# Patient Record
Sex: Female | Born: 1940
Health system: Southern US, Community
[De-identification: ages and names within clinical notes are randomized; demographics above are authoritative.]

## PROBLEM LIST (undated history)

## (undated) DIAGNOSIS — K219 Gastro-esophageal reflux disease without esophagitis: Secondary | ICD-10-CM

## (undated) DIAGNOSIS — E785 Hyperlipidemia, unspecified: Secondary | ICD-10-CM

## (undated) DIAGNOSIS — Z860101 Personal history of adenomatous and serrated colon polyps: Secondary | ICD-10-CM

## (undated) DIAGNOSIS — L570 Actinic keratosis: Secondary | ICD-10-CM

## (undated) DIAGNOSIS — Z86010 Personal history of colonic polyps: Secondary | ICD-10-CM

## (undated) HISTORY — PX: FLEXIBLE SIGMOIDOSCOPY: SHX1649

## (undated) HISTORY — DX: Actinic keratosis: L57.0

## (undated) HISTORY — PX: CHOLECYSTECTOMY: SHX55

## (undated) HISTORY — PX: ESOPHAGOGASTRODUODENOSCOPY: SHX1529

## (undated) HISTORY — DX: Hyperlipidemia, unspecified: E78.5

## (undated) HISTORY — PX: VARICOSE VEIN SURGERY: SHX832

## (undated) HISTORY — PX: APPENDECTOMY: SHX54

## (undated) HISTORY — PX: COLONOSCOPY: SHX174

## (undated) HISTORY — PX: BREAST CYST ASPIRATION: SHX578

---

## 2005-01-10 ENCOUNTER — Ambulatory Visit: Payer: Self-pay | Admitting: Obstetrics and Gynecology

## 2005-02-15 ENCOUNTER — Ambulatory Visit: Payer: Self-pay | Admitting: Gastroenterology

## 2006-01-30 ENCOUNTER — Ambulatory Visit: Payer: Self-pay | Admitting: Obstetrics and Gynecology

## 2007-02-14 ENCOUNTER — Ambulatory Visit: Payer: Self-pay | Admitting: Obstetrics and Gynecology

## 2007-08-19 ENCOUNTER — Ambulatory Visit: Payer: Self-pay | Admitting: Surgery

## 2008-07-14 ENCOUNTER — Ambulatory Visit: Payer: Self-pay | Admitting: Unknown Physician Specialty

## 2008-10-07 ENCOUNTER — Ambulatory Visit: Payer: Self-pay | Admitting: Obstetrics and Gynecology

## 2009-10-11 ENCOUNTER — Ambulatory Visit: Payer: Self-pay | Admitting: Obstetrics and Gynecology

## 2010-09-21 ENCOUNTER — Ambulatory Visit: Payer: Self-pay | Admitting: Family Medicine

## 2010-10-13 ENCOUNTER — Ambulatory Visit: Payer: Self-pay | Admitting: Obstetrics and Gynecology

## 2010-11-15 ENCOUNTER — Ambulatory Visit: Payer: Self-pay | Admitting: Obstetrics and Gynecology

## 2011-10-16 ENCOUNTER — Ambulatory Visit: Payer: Self-pay | Admitting: Obstetrics and Gynecology

## 2012-12-19 ENCOUNTER — Ambulatory Visit: Payer: Self-pay | Admitting: Obstetrics and Gynecology

## 2013-11-14 ENCOUNTER — Ambulatory Visit: Payer: Self-pay | Admitting: Unknown Physician Specialty

## 2013-11-17 LAB — PATHOLOGY REPORT

## 2014-03-13 ENCOUNTER — Ambulatory Visit: Payer: Self-pay | Admitting: Obstetrics and Gynecology

## 2015-01-21 ENCOUNTER — Encounter: Payer: Self-pay | Admitting: Family Medicine

## 2015-01-21 ENCOUNTER — Ambulatory Visit (INDEPENDENT_AMBULATORY_CARE_PROVIDER_SITE_OTHER): Payer: Medicare Other | Admitting: Family Medicine

## 2015-01-21 VITALS — BP 144/84 | HR 61 | Temp 98.7°F | Ht 63.0 in | Wt 142.0 lb

## 2015-01-21 DIAGNOSIS — R6889 Other general symptoms and signs: Secondary | ICD-10-CM | POA: Diagnosis not present

## 2015-01-21 DIAGNOSIS — H6123 Impacted cerumen, bilateral: Secondary | ICD-10-CM | POA: Insufficient documentation

## 2015-01-21 DIAGNOSIS — H918X9 Other specified hearing loss, unspecified ear: Secondary | ICD-10-CM | POA: Diagnosis not present

## 2015-01-21 DIAGNOSIS — H6120 Impacted cerumen, unspecified ear: Secondary | ICD-10-CM | POA: Diagnosis not present

## 2015-01-21 DIAGNOSIS — E7800 Pure hypercholesterolemia, unspecified: Secondary | ICD-10-CM

## 2015-01-21 LAB — LP+ALT+AST PICCOLO, WAIVED
ALT (SGPT) Piccolo, Waived: 17 U/L (ref 10–47)
AST (SGOT) Piccolo, Waived: 44 U/L — ABNORMAL HIGH (ref 11–38)
Chol/HDL Ratio Piccolo,Waive: 2.4 mg/dL
Cholesterol Piccolo, Waived: 160 mg/dL
HDL Chol Piccolo, Waived: 66 mg/dL
LDL Chol Calc Piccolo Waived: 81 mg/dL
Triglycerides Piccolo,Waived: 68 mg/dL
VLDL Chol Calc Piccolo,Waive: 14 mg/dL

## 2015-01-21 MED ORDER — SIMVASTATIN 20 MG PO TABS: 20.0000 mg | ORAL_TABLET | Freq: Every day | ORAL | Status: DC

## 2015-01-21 NOTE — Progress Notes (Signed)
   BP 144/84 mmHg  Pulse 61  Temp(Src) 98.7 F (37.1 C)  Ht 5\' 3"  (1.6 m)  Wt 142 lb (64.411 kg)  BMI 25.16 kg/m2  SpO2 97%   Subjective:    Patient ID: Brianna Stevens, female    DOB: 1940/08/13, 74 y.o.   MRN: [id number]  HPI: Brianna Stevens is a 74 y.o. female  Chief Complaint  Patient presents with  . ear cleaning  . Hyperlipidemia  . labwork   patient taking simvastatin with no issues or concerns takes everyday no noted side effects has been taking long-term with good lipid readings Had full blood work done at physical needs 6 month blood work done today  Patient also with stopped up ears over the last month or so  Patient also with legs extremities seem to be colder than usual with some cold intolerance and fatigue symptoms Patient also with history of varicose faint and some edema at the end of the day especially worse since on a barstool Relevant past medical, surgical, family and social history reviewed and updated as indicated. Interim medical history since our last visit reviewed. Allergies and medications reviewed and updated.  Review of Systems  Constitutional: Negative.   Respiratory: Negative.   Cardiovascular: Negative.     Per HPI unless specifically indicated above     Objective:    BP 144/84 mmHg  Pulse 61  Temp(Src) 98.7 F (37.1 C)  Ht 5\' 3"  (1.6 m)  Wt 142 lb (64.411 kg)  BMI 25.16 kg/m2  SpO2 97%  Wt Readings from Last 3 Encounters:  01/21/15 142 lb (64.411 kg)  12/09/12 138 lb (62.596 kg)    Physical Exam  Constitutional: She is oriented to person, place, and time. She appears well-developed and well-nourished. No distress.  HENT:  Head: Normocephalic and atraumatic.  Right Ear: Hearing normal.  Left Ear: Hearing normal.  Nose: Nose normal.  Ear canals plugged with cerumen removed with water and instrumentation revealing normal ear canals and tympanic membranes  Eyes: Conjunctivae and lids are normal. Right eye exhibits no  discharge. Left eye exhibits no discharge. No scleral icterus.  Pulmonary/Chest: Effort normal. No respiratory distress.  Musculoskeletal: Normal range of motion.  Legs normal with no edema mild varicosities  Neurological: She is alert and oriented to person, place, and time.  Skin: Skin is intact. No rash noted.  Psychiatric: She has a normal mood and affect. Her speech is normal and behavior is normal. Judgment and thought content normal. Cognition and memory are normal.        Assessment & Plan:   Problem List Items Addressed This Visit      Nervous and Auditory   Bilateral hearing loss due to cerumen impaction     Other   Hypercholesteremia - Primary   Relevant Medications   simvastatin (ZOCOR) 20 MG tablet   Other Relevant Orders   LP+ALT+AST Piccolo, Waived    Other Visit Diagnoses    Cold intolerance        Will check TSH    Relevant Orders    TSH        Follow up plan: Return in about 1 year (around 01/21/2016) for LIPID RECHECK.

## 2015-01-22 LAB — TSH: TSH: 2.42 u[IU]/mL (ref 0.450–4.500)

## 2015-01-25 ENCOUNTER — Encounter: Payer: Self-pay | Admitting: Family Medicine

## 2015-02-05 ENCOUNTER — Telehealth: Payer: Self-pay

## 2015-02-05 ENCOUNTER — Other Ambulatory Visit: Payer: Self-pay | Admitting: Family Medicine

## 2015-02-05 MED ORDER — MECLIZINE HCL 32 MG PO TABS: 32.0000 mg | ORAL_TABLET | Freq: Three times a day (TID) | ORAL | Status: DC | PRN

## 2015-02-05 NOTE — Telephone Encounter (Signed)
Corrected Meclizine Rx to 25mg  vs 32, needs to be corrected in chart Pharmacy accepted verbal order

## 2015-02-16 ENCOUNTER — Other Ambulatory Visit: Payer: Self-pay | Admitting: Obstetrics and Gynecology

## 2015-02-16 DIAGNOSIS — Z1231 Encounter for screening mammogram for malignant neoplasm of breast: Secondary | ICD-10-CM

## 2015-03-18 ENCOUNTER — Ambulatory Visit
Admission: RE | Admit: 2015-03-18 | Discharge: 2015-03-18 | Disposition: A | Discharge status: Home / Self Care | Payer: Medicare Other | Source: Ambulatory Visit | Attending: Obstetrics and Gynecology | Admitting: Obstetrics and Gynecology

## 2015-03-18 DIAGNOSIS — Z1231 Encounter for screening mammogram for malignant neoplasm of breast: Secondary | ICD-10-CM | POA: Diagnosis not present

## 2015-06-10 ENCOUNTER — Encounter: Payer: Self-pay | Admitting: Family Medicine

## 2015-07-26 ENCOUNTER — Other Ambulatory Visit: Payer: Self-pay | Admitting: Family Medicine

## 2015-07-26 ENCOUNTER — Other Ambulatory Visit: Payer: PPO

## 2015-07-26 DIAGNOSIS — Z5181 Encounter for therapeutic drug level monitoring: Secondary | ICD-10-CM | POA: Diagnosis not present

## 2015-07-26 DIAGNOSIS — R748 Abnormal levels of other serum enzymes: Secondary | ICD-10-CM | POA: Diagnosis not present

## 2015-07-27 ENCOUNTER — Telehealth: Payer: Self-pay | Admitting: Family Medicine

## 2015-07-27 DIAGNOSIS — R7309 Other abnormal glucose: Secondary | ICD-10-CM

## 2015-07-27 DIAGNOSIS — E875 Hyperkalemia: Secondary | ICD-10-CM | POA: Insufficient documentation

## 2015-07-27 LAB — COMPREHENSIVE METABOLIC PANEL
ALT: 11 IU/L (ref 0–32)
AST: 20 IU/L (ref 0–40)
Albumin/Globulin Ratio: 1.4 (ref 1.1–2.5)
Albumin: 4.4 g/dL (ref 3.5–4.8)
Alkaline Phosphatase: 58 IU/L (ref 39–117)
BUN / CREAT RATIO: 13 (ref 11–26)
BUN: 12 mg/dL (ref 8–27)
Bilirubin Total: 0.5 mg/dL (ref 0.0–1.2)
CHLORIDE: 104 mmol/L (ref 96–106)
CO2: 24 mmol/L (ref 18–29)
CREATININE: 0.93 mg/dL (ref 0.57–1.00)
Calcium: 9.7 mg/dL (ref 8.7–10.3)
GFR calc Af Amer: 69 mL/min/{1.73_m2} (ref >59)
GFR calc non Af Amer: 60 mL/min/{1.73_m2} (ref >59)
GLUCOSE: 104 mg/dL — AB (ref 65–99)
Globulin, Total: 3.1 g/dL (ref 1.5–4.5)
Potassium: 5.8 mmol/L — ABNORMAL HIGH (ref 3.5–5.2)
SODIUM: 142 mmol/L (ref 134–144)
Total Protein: 7.5 g/dL (ref 6.0–8.5)

## 2015-07-27 LAB — LIPID PANEL W/O CHOL/HDL RATIO
Cholesterol, Total: 192 mg/dL (ref 100–199)
HDL: 66 mg/dL (ref >39)
LDL Calculated: 111 mg/dL — ABNORMAL HIGH (ref 0–99)
TRIGLYCERIDES: 73 mg/dL (ref 0–149)
VLDL CHOLESTEROL CAL: 15 mg/dL (ref 5–40)

## 2015-07-27 NOTE — Telephone Encounter (Signed)
I never received a call from the lab Izora Gala, Please call patient ask her to get a STAT potassium drawn on Wednesday (see note below) Thank you

## 2015-07-27 NOTE — Telephone Encounter (Signed)
Patient's K+ is elevated; I called her home number, left msg, asked her to call back ASAP, very important I have entered STAT K+ to be drawn at the hospital [facility] staff:  When patient calls, let her know one of her labs was very high, her potassium; we need to have her get a STAT recheck at the hospital NOW Sometimes that is the result of red blood cells breaking open inside the tube which is not worrisome; however, if her potassium is really as high as it is, we'll need to do something about it I have entered the order, and put my cell # for the results to be called to me this afternoon Please just document where I can reach the patient when her lab comes back so I can call her

## 2015-07-28 ENCOUNTER — Other Ambulatory Visit
Admission: RE | Admit: 2015-07-28 | Discharge: 2015-07-28 | Disposition: A | Discharge status: Home / Self Care | Payer: PPO | Source: Ambulatory Visit | Attending: Family Medicine | Admitting: Family Medicine

## 2015-07-28 DIAGNOSIS — R7301 Impaired fasting glucose: Secondary | ICD-10-CM | POA: Insufficient documentation

## 2015-07-28 DIAGNOSIS — R7309 Other abnormal glucose: Secondary | ICD-10-CM | POA: Insufficient documentation

## 2015-07-28 DIAGNOSIS — E875 Hyperkalemia: Secondary | ICD-10-CM | POA: Insufficient documentation

## 2015-07-28 LAB — POTASSIUM: Potassium: 4.7 mmol/L (ref 3.5–5.1)

## 2015-07-28 NOTE — Telephone Encounter (Signed)
Called patient, no answer but left msg that all was well and to call back with any questions or concerns.

## 2015-07-28 NOTE — Telephone Encounter (Signed)
Please let patient know that her recheck potassium was completely normal -- great news I suspect that her first blood tube was hemolyzed and that interfered with the true reading Her other labs overall look very good; her glucose is a little above normal, but not in the diabetes range or anything; just watch sweets and f/u with Dr. Jeananne Rama at next appt

## 2015-07-28 NOTE — Telephone Encounter (Signed)
I spoke with patient this morning, she got her voicemail last night about 8 o'clock.  She is going to have the bloodwork done today as soon as she can get dressed.

## 2015-10-20 DIAGNOSIS — K219 Gastro-esophageal reflux disease without esophagitis: Secondary | ICD-10-CM | POA: Diagnosis not present

## 2015-10-20 DIAGNOSIS — R140 Abdominal distension (gaseous): Secondary | ICD-10-CM | POA: Diagnosis not present

## 2015-12-30 DIAGNOSIS — L84 Corns and callosities: Secondary | ICD-10-CM | POA: Diagnosis not present

## 2015-12-30 DIAGNOSIS — D485 Neoplasm of uncertain behavior of skin: Secondary | ICD-10-CM | POA: Diagnosis not present

## 2015-12-30 DIAGNOSIS — I8393 Asymptomatic varicose veins of bilateral lower extremities: Secondary | ICD-10-CM | POA: Diagnosis not present

## 2015-12-30 DIAGNOSIS — D1800 Hemangioma unspecified site: Secondary | ICD-10-CM | POA: Diagnosis not present

## 2015-12-30 DIAGNOSIS — L812 Freckles: Secondary | ICD-10-CM | POA: Diagnosis not present

## 2015-12-30 DIAGNOSIS — Z1283 Encounter for screening for malignant neoplasm of skin: Secondary | ICD-10-CM | POA: Diagnosis not present

## 2015-12-30 DIAGNOSIS — R21 Rash and other nonspecific skin eruption: Secondary | ICD-10-CM | POA: Diagnosis not present

## 2015-12-30 DIAGNOSIS — L578 Other skin changes due to chronic exposure to nonionizing radiation: Secondary | ICD-10-CM | POA: Diagnosis not present

## 2015-12-30 DIAGNOSIS — L821 Other seborrheic keratosis: Secondary | ICD-10-CM | POA: Diagnosis not present

## 2015-12-30 DIAGNOSIS — D229 Melanocytic nevi, unspecified: Secondary | ICD-10-CM | POA: Diagnosis not present

## 2015-12-30 DIAGNOSIS — Z85828 Personal history of other malignant neoplasm of skin: Secondary | ICD-10-CM | POA: Diagnosis not present

## 2016-01-25 ENCOUNTER — Ambulatory Visit: Payer: Medicare Other | Admitting: Family Medicine

## 2016-02-01 ENCOUNTER — Ambulatory Visit (INDEPENDENT_AMBULATORY_CARE_PROVIDER_SITE_OTHER): Payer: PPO | Admitting: Family Medicine

## 2016-02-01 ENCOUNTER — Encounter: Payer: Self-pay | Admitting: Family Medicine

## 2016-02-01 VITALS — BP 149/81 | HR 60 | Temp 97.7°F | Ht 62.5 in | Wt 142.0 lb

## 2016-02-01 DIAGNOSIS — H6123 Impacted cerumen, bilateral: Secondary | ICD-10-CM | POA: Diagnosis not present

## 2016-02-01 DIAGNOSIS — E875 Hyperkalemia: Secondary | ICD-10-CM

## 2016-02-01 DIAGNOSIS — E7800 Pure hypercholesterolemia, unspecified: Secondary | ICD-10-CM

## 2016-02-01 DIAGNOSIS — R748 Abnormal levels of other serum enzymes: Secondary | ICD-10-CM | POA: Diagnosis not present

## 2016-02-01 DIAGNOSIS — R7309 Other abnormal glucose: Secondary | ICD-10-CM | POA: Diagnosis not present

## 2016-02-01 LAB — URINALYSIS, ROUTINE W REFLEX MICROSCOPIC
Bilirubin, UA: NEGATIVE
Glucose, UA: NEGATIVE
KETONES UA: NEGATIVE
Leukocytes, UA: NEGATIVE
NITRITE UA: NEGATIVE
Protein, UA: NEGATIVE
Specific Gravity, UA: 1.015 (ref 1.005–1.030)
UUROB: 0.2 mg/dL (ref 0.2–1.0)
pH, UA: 8 — ABNORMAL HIGH (ref 5.0–7.5)

## 2016-02-01 LAB — MICROSCOPIC EXAMINATION: WBC UA: NONE SEEN /HPF (ref 0–?)

## 2016-02-01 MED ORDER — SIMVASTATIN 20 MG PO TABS: 20.0000 mg | ORAL_TABLET | Freq: Every day | ORAL | 12 refills | Status: DC

## 2016-02-01 NOTE — Assessment & Plan Note (Signed)
Check LABS

## 2016-02-01 NOTE — Assessment & Plan Note (Signed)
Check labs 

## 2016-02-01 NOTE — Progress Notes (Signed)
BP (!) 149/81 (BP Location: Left Arm, Patient Position: Sitting, Cuff Size: Normal)   Pulse 60   Temp 97.7 F (36.5 C)   Ht 5' 2.5" (1.588 m)   Wt 142 lb (64.4 kg)   SpO2 99%   BMI 25.56 kg/m    Subjective:    Patient ID: Brianna Stevens, female    DOB: September 05, 1940, 75 y.o.   MRN: [id number]  HPI: Brianna Stevens is a 75 y.o. female  Patient recheck cholesterol lipids been doing well with no complaints of indications taken faithfully Has had an history of elevated glucose in the past with high potassium Will check labs to review Has intermittent vertigo takes meclizine which seems to help Intermittent allergies  Reflux controlled with Prilosec   Relevant past medical, surgical, family and social history reviewed and updated as indicated. Interim medical history since our last visit reviewed. Allergies and medications reviewed and updated.  Review of Systems  Constitutional: Negative.   HENT: Negative.   Eyes: Negative.   Respiratory: Negative.   Cardiovascular: Negative.   Gastrointestinal: Negative.   Endocrine: Negative.   Genitourinary: Negative.   Musculoskeletal: Negative.   Skin: Negative.   Allergic/Immunologic: Negative.   Neurological: Negative.   Hematological: Negative.   Psychiatric/Behavioral: Negative.     Per HPI unless specifically indicated above     Objective:    BP (!) 149/81 (BP Location: Left Arm, Patient Position: Sitting, Cuff Size: Normal)   Pulse 60   Temp 97.7 F (36.5 C)   Ht 5' 2.5" (1.588 m)   Wt 142 lb (64.4 kg)   SpO2 99%   BMI 25.56 kg/m   Wt Readings from Last 3 Encounters:  02/01/16 142 lb (64.4 kg)  01/21/15 142 lb (64.4 kg)  12/09/12 138 lb (62.6 kg)    Physical Exam  Constitutional: She is oriented to person, place, and time. She appears well-developed and well-nourished. No distress.  HENT:  Head: Normocephalic and atraumatic.  Right Ear: Hearing and external ear normal.  Left Ear: Hearing and external ear  normal.  Nose: Nose normal.  Mouth/Throat: No oropharyngeal exudate.  Ears blocked with cerumen removed with instruments and water with resolution of decreased hearing especially in right ear  Eyes: Conjunctivae and lids are normal. Right eye exhibits no discharge. Left eye exhibits no discharge. No scleral icterus.  Cardiovascular: Normal rate, regular rhythm and normal heart sounds.   Pulmonary/Chest: Effort normal and breath sounds normal. No respiratory distress.  Abdominal: Soft. Bowel sounds are normal.  Musculoskeletal: Normal range of motion. She exhibits no edema, tenderness or deformity.  Neurological: She is alert and oriented to person, place, and time.  Skin: Skin is intact. No rash noted.  Psychiatric: She has a normal mood and affect. Her speech is normal and behavior is normal. Judgment and thought content normal. Cognition and memory are normal.    Results for orders placed or performed during the hospital encounter of 07/28/15  Potassium  Result Value Ref Range   Potassium 4.7 3.5 - 5.1 mmol/L      Assessment & Plan:   Problem List Items Addressed This Visit      Other   Hypercholesteremia - Primary    The current medical regimen is effective;  continue present plan and medications.       Relevant Medications   simvastatin (ZOCOR) 20 MG tablet   Other Relevant Orders   Comprehensive metabolic panel   Lipid panel   CBC with Differential/Platelet  TSH   Urinalysis, Routine w reflex microscopic (not at [facility])   Elevated glucose    Check LABS      Relevant Orders   Comprehensive metabolic panel   CBC with Differential/Platelet   TSH   Urinalysis, Routine w reflex microscopic (not at [facility])   RESOLVED: Abnormal liver enzymes    Check labs      Relevant Orders   Comprehensive metabolic panel   CBC with Differential/Platelet   TSH   Urinalysis, Routine w reflex microscopic (not at [facility])   RESOLVED: Hyperkalemia    Check labs      Relevant Orders     Comprehensive metabolic panel   CBC with Differential/Platelet   TSH   Urinalysis, Routine w reflex microscopic (not at [facility])    Other Visit Diagnoses   None.      Follow up plan: Return in about 6 months (around 08/03/2016) for lipid panel alt, ast.

## 2016-02-01 NOTE — Assessment & Plan Note (Signed)
The current medical regimen is effective;  continue present plan and medications.  

## 2016-02-02 LAB — COMPREHENSIVE METABOLIC PANEL
A/G RATIO: 1.5 (ref 1.2–2.2)
ALT: 17 IU/L (ref 0–32)
AST: 25 IU/L (ref 0–40)
Albumin: 4.5 g/dL (ref 3.5–4.8)
Alkaline Phosphatase: 62 IU/L (ref 39–117)
BUN/Creatinine Ratio: 16 (ref 12–28)
BUN: 14 mg/dL (ref 8–27)
Bilirubin Total: 0.5 mg/dL (ref 0.0–1.2)
CALCIUM: 9.9 mg/dL (ref 8.7–10.3)
CO2: 26 mmol/L (ref 18–29)
Chloride: 100 mmol/L (ref 96–106)
Creatinine, Ser: 0.88 mg/dL (ref 0.57–1.00)
GFR calc Af Amer: 74 mL/min/{1.73_m2} (ref >59)
GFR, EST NON AFRICAN AMERICAN: 64 mL/min/{1.73_m2} (ref >59)
GLUCOSE: 94 mg/dL (ref 65–99)
Globulin, Total: 3 g/dL (ref 1.5–4.5)
POTASSIUM: 5.4 mmol/L — AB (ref 3.5–5.2)
Sodium: 142 mmol/L (ref 134–144)
Total Protein: 7.5 g/dL (ref 6.0–8.5)

## 2016-02-02 LAB — CBC WITH DIFFERENTIAL/PLATELET
Basophils Absolute: 0 10*3/uL (ref 0.0–0.2)
Basos: 1 %
EOS (ABSOLUTE): 0.2 10*3/uL (ref 0.0–0.4)
EOS: 3 %
HEMATOCRIT: 42.6 % (ref 34.0–46.6)
HEMOGLOBIN: 14.3 g/dL (ref 11.1–15.9)
Immature Grans (Abs): 0 10*3/uL (ref 0.0–0.1)
Immature Granulocytes: 0 %
LYMPHS ABS: 3.6 10*3/uL — AB (ref 0.7–3.1)
Lymphs: 54 %
MCH: 31 pg (ref 26.6–33.0)
MCHC: 33.6 g/dL (ref 31.5–35.7)
MCV: 92 fL (ref 79–97)
MONOCYTES: 9 %
Monocytes Absolute: 0.6 10*3/uL (ref 0.1–0.9)
NEUTROS ABS: 2.1 10*3/uL (ref 1.4–7.0)
Neutrophils: 33 %
Platelets: 236 10*3/uL (ref 150–379)
RBC: 4.61 x10E6/uL (ref 3.77–5.28)
RDW: 13.3 % (ref 12.3–15.4)
WBC: 6.5 10*3/uL (ref 3.4–10.8)

## 2016-02-02 LAB — LIPID PANEL
CHOL/HDL RATIO: 3 ratio (ref 0.0–4.4)
Cholesterol, Total: 174 mg/dL (ref 100–199)
HDL: 58 mg/dL (ref >39)
LDL Calculated: 91 mg/dL (ref 0–99)
TRIGLYCERIDES: 125 mg/dL (ref 0–149)
VLDL CHOLESTEROL CAL: 25 mg/dL (ref 5–40)

## 2016-02-02 LAB — TSH: TSH: 3.16 u[IU]/mL (ref 0.450–4.500)

## 2016-02-03 ENCOUNTER — Telehealth: Payer: Self-pay | Admitting: Family Medicine

## 2016-02-03 DIAGNOSIS — E875 Hyperkalemia: Secondary | ICD-10-CM

## 2016-02-03 NOTE — Telephone Encounter (Signed)
Phone call Discussed with patient elevated potassium will recheck BMP later this month

## 2016-02-18 DIAGNOSIS — Z1211 Encounter for screening for malignant neoplasm of colon: Secondary | ICD-10-CM | POA: Diagnosis not present

## 2016-02-18 DIAGNOSIS — Z01419 Encounter for gynecological examination (general) (routine) without abnormal findings: Secondary | ICD-10-CM | POA: Diagnosis not present

## 2016-02-18 DIAGNOSIS — Z124 Encounter for screening for malignant neoplasm of cervix: Secondary | ICD-10-CM | POA: Diagnosis not present

## 2016-02-24 ENCOUNTER — Other Ambulatory Visit: Payer: Self-pay | Admitting: Obstetrics and Gynecology

## 2016-02-24 DIAGNOSIS — Z1231 Encounter for screening mammogram for malignant neoplasm of breast: Secondary | ICD-10-CM

## 2016-03-10 ENCOUNTER — Other Ambulatory Visit: Payer: PPO

## 2016-03-10 DIAGNOSIS — E875 Hyperkalemia: Secondary | ICD-10-CM | POA: Diagnosis not present

## 2016-03-11 LAB — BASIC METABOLIC PANEL
BUN/Creatinine Ratio: 16 (ref 12–28)
BUN: 15 mg/dL (ref 8–27)
CHLORIDE: 101 mmol/L (ref 96–106)
CO2: 26 mmol/L (ref 18–29)
CREATININE: 0.95 mg/dL (ref 0.57–1.00)
Calcium: 9.8 mg/dL (ref 8.7–10.3)
GFR calc Af Amer: 67 mL/min/{1.73_m2} (ref >59)
GFR calc non Af Amer: 58 mL/min/{1.73_m2} — ABNORMAL LOW (ref >59)
GLUCOSE: 99 mg/dL (ref 65–99)
Potassium: 4.9 mmol/L (ref 3.5–5.2)
SODIUM: 142 mmol/L (ref 134–144)

## 2016-03-12 ENCOUNTER — Encounter: Payer: Self-pay | Admitting: Family Medicine

## 2016-03-20 ENCOUNTER — Ambulatory Visit
Admission: RE | Admit: 2016-03-20 | Discharge: 2016-03-20 | Disposition: A | Discharge status: Home / Self Care | Payer: PPO | Source: Ambulatory Visit | Attending: Obstetrics and Gynecology | Admitting: Obstetrics and Gynecology

## 2016-03-20 ENCOUNTER — Other Ambulatory Visit: Payer: Self-pay | Admitting: Obstetrics and Gynecology

## 2016-03-20 DIAGNOSIS — Z1231 Encounter for screening mammogram for malignant neoplasm of breast: Secondary | ICD-10-CM

## 2016-07-10 ENCOUNTER — Other Ambulatory Visit: Payer: Self-pay | Admitting: Family Medicine

## 2016-07-10 NOTE — Telephone Encounter (Signed)
Routing to provider. Appt on 08/03/16.

## 2016-08-03 ENCOUNTER — Encounter: Payer: Self-pay | Admitting: Family Medicine

## 2016-08-03 ENCOUNTER — Ambulatory Visit (INDEPENDENT_AMBULATORY_CARE_PROVIDER_SITE_OTHER): Payer: PPO | Admitting: Family Medicine

## 2016-08-03 VITALS — BP 142/89 | HR 67 | Temp 97.8°F | Ht 63.0 in | Wt 148.0 lb

## 2016-08-03 DIAGNOSIS — H6123 Impacted cerumen, bilateral: Secondary | ICD-10-CM | POA: Diagnosis not present

## 2016-08-03 DIAGNOSIS — R7309 Other abnormal glucose: Secondary | ICD-10-CM

## 2016-08-03 DIAGNOSIS — E7800 Pure hypercholesterolemia, unspecified: Secondary | ICD-10-CM | POA: Diagnosis not present

## 2016-08-03 LAB — LP+ALT+AST PICCOLO, WAIVED
ALT (SGPT) Piccolo, Waived: 35 U/L (ref 10–47)
AST (SGOT) Piccolo, Waived: 16 U/L (ref 11–38)
CHOL/HDL RATIO PICCOLO,WAIVE: 2.7 mg/dL
CHOLESTEROL PICCOLO, WAIVED: 186 mg/dL (ref <200)
HDL CHOL PICCOLO, WAIVED: 69 mg/dL (ref >59)
LDL CHOL CALC PICCOLO WAIVED: 99 mg/dL (ref <100)
TRIGLYCERIDES PICCOLO,WAIVED: 87 mg/dL (ref <150)
VLDL Chol Calc Piccolo,Waive: 17 mg/dL (ref <30)

## 2016-08-03 MED ORDER — MECLIZINE HCL 25 MG PO TABS: 25.0000 mg | ORAL_TABLET | Freq: Three times a day (TID) | ORAL | 7 refills | Status: DC | PRN

## 2016-08-03 NOTE — Assessment & Plan Note (Signed)
The current medical regimen is effective;  continue present plan and medications.  

## 2016-08-03 NOTE — Progress Notes (Signed)
BP (!) 142/89   Pulse 67   Temp 97.8 F (36.6 C) (Oral)   Ht 5\' 3"  (1.6 m)   Wt 148 lb (67.1 kg)   BMI 26.22 kg/m    Subjective:    Patient ID: Brianna Stevens, female    DOB: 04/13/1941, 76 y.o.   MRN: [id number]  HPI: Brianna Stevens is a 76 y.o. female  Chief Complaint  Patient presents with  . Follow-up  . Ear Fullness    Right.    Ear fullness is been ongoing for several weeks patient also has intermittent dizziness. On further review sometimes has some tinnitus and some ear muffling as noted above. This comes and goes meclizine seems to help the most sometimes able to walk with her head tilted to the side and controls her symptoms that way. Has done Epley maneuvers with controlling her symptoms also. Takes cholesterol medicine without problems or issues taking faithfully each of relatively good diet. Relevant past medical, surgical, family and social history reviewed and updated as indicated. Interim medical history since our last visit reviewed. Allergies and medications reviewed and updated.  Review of Systems  Constitutional: Negative.   Respiratory: Negative.   Cardiovascular: Negative.     Per HPI unless specifically indicated above     Objective:    BP (!) 142/89   Pulse 67   Temp 97.8 F (36.6 C) (Oral)   Ht 5\' 3"  (1.6 m)   Wt 148 lb (67.1 kg)   BMI 26.22 kg/m   Wt Readings from Last 3 Encounters:  08/03/16 148 lb (67.1 kg)  02/01/16 142 lb (64.4 kg)  01/21/15 142 lb (64.4 kg)    Physical Exam  Constitutional: She is oriented to person, place, and time. She appears well-developed and well-nourished. No distress.  HENT:  Head: Normocephalic and atraumatic.  Right Ear: Hearing and external ear normal.  Left Ear: Hearing and external ear normal.  Nose: Nose normal.  Right ear blocked with wax removed with instruments and water revealing normal canal TM and resolution of patient's symptoms left ear also cleaned out with instruments and water.    Eyes: Conjunctivae and lids are normal. Right eye exhibits no discharge. Left eye exhibits no discharge. No scleral icterus.  Cardiovascular: Normal rate, regular rhythm and normal heart sounds.   Pulmonary/Chest: Effort normal and breath sounds normal. No respiratory distress.  Musculoskeletal: Normal range of motion.  Neurological: She is alert and oriented to person, place, and time.  Skin: Skin is intact. No rash noted.  Psychiatric: She has a normal mood and affect. Her speech is normal and behavior is normal. Judgment and thought content normal. Cognition and memory are normal.    Results for orders placed or performed in visit on [id number]  Basic metabolic panel  Result Value Ref Range   Glucose 99 65 - 99 mg/dL   BUN 15 8 - 27 mg/dL   Creatinine, Ser 0.95 0.57 - 1.00 mg/dL   GFR calc non Af Amer 58 (L) >59 mL/min/1.73   GFR calc Af Amer 67 >59 mL/min/1.73   BUN/Creatinine Ratio 16 12 - 28   Sodium 142 134 - 144 mmol/L   Potassium 4.9 3.5 - 5.2 mmol/L   Chloride 101 96 - 106 mmol/L   CO2 26 18 - 29 mmol/L   Calcium 9.8 8.7 - 10.3 mg/dL      Assessment & Plan:   Problem List Items Addressed This Visit      Nervous and  Auditory   Bilateral hearing loss due to cerumen impaction     Other   Hypercholesteremia - Primary    The current medical regimen is effective;  continue present plan and medications.       Relevant Orders   LP+ALT+AST Piccolo, Waived   Elevated glucose   Relevant Orders   LP+ALT+AST Piccolo, Waived       Follow up plan: Return for As scheduled.

## 2016-11-16 ENCOUNTER — Telehealth: Payer: Self-pay | Admitting: Family Medicine

## 2016-11-16 NOTE — Telephone Encounter (Signed)
Called pt to move Annual Wellness Visit to [facility] & schedule CPE w/DR. Crissman for June - knb

## 2016-11-29 ENCOUNTER — Ambulatory Visit (INDEPENDENT_AMBULATORY_CARE_PROVIDER_SITE_OTHER): Payer: PPO

## 2016-11-29 VITALS — BP 118/70 | HR 74 | Temp 97.8°F | Resp 16 | Ht 63.0 in | Wt 146.2 lb

## 2016-11-29 DIAGNOSIS — Z0000 Encounter for general adult medical examination without abnormal findings: Secondary | ICD-10-CM | POA: Diagnosis not present

## 2016-11-29 NOTE — Patient Instructions (Addendum)
Brianna Stevens , Thank you for taking time to come for your Medicare Wellness Visit. I appreciate your ongoing commitment to your health goals. Please review the following plan we discussed and let me know if I can assist you in the future.   Screening recommendations/referrals: Colonoscopy: completed in 2013 Mammogram: completed 03/2016 Bone Density: completed 09/2011 Recommended yearly ophthalmology/optometry visit for glaucoma screening and checkup Recommended yearly dental visit for hygiene and checkup  Vaccinations: Influenza vaccine: due 03/2017 Pneumococcal vaccine: completed Tdap vaccine: declined    Shingles vaccine: Completed 07/2015  Advanced directives: Please bring a copy of your living will and health care power of attorney at your convenience.   Conditions/risks identified: Recommend to continue drinking 3-4 glasses of water a day.   Next appointment: Follow up with Dr.Crissman on 02/05/2017 at 9:45am. Follow up in one year for your annual wellness exam.   Preventive Care 65 Years and Older, Female Preventive care refers to lifestyle choices and visits with your health care provider that can promote health and wellness. What does preventive care include?  A yearly physical exam. This is also called an annual well check.  Dental exams once or twice a year.  Routine eye exams. Ask your health care provider how often you should have your eyes checked.  Personal lifestyle choices, including:  Daily care of your teeth and gums.  Regular physical activity.  Eating a healthy diet.  Avoiding tobacco and drug use.  Limiting alcohol use.  Practicing safe sex.  Taking low-dose aspirin every day.  Taking vitamin and mineral supplements as recommended by your health care provider. What happens during an annual well check? The services and screenings done by your health care provider during your annual well check will depend on your age, overall health, lifestyle risk  factors, and family history of disease. Counseling  Your health care provider may ask you questions about your:  Alcohol use.  Tobacco use.  Drug use.  Emotional well-being.  Home and relationship well-being.  Sexual activity.  Eating habits.  History of falls.  Memory and ability to understand (cognition).  Work and work Statistician.  Reproductive health. Screening  You may have the following tests or measurements:  Height, weight, and BMI.  Blood pressure.  Lipid and cholesterol levels. These may be checked every 5 years, or more frequently if you are over 50 years old.  Skin check.  Lung cancer screening. You may have this screening every year starting at age 32 if you have a 30-pack-year history of smoking and currently smoke or have quit within the past 15 years.  Fecal occult blood test (FOBT) of the stool. You may have this test every year starting at age 14.  Flexible sigmoidoscopy or colonoscopy. You may have a sigmoidoscopy every 5 years or a colonoscopy every 10 years starting at age 63.  Hepatitis C blood test.  Hepatitis B blood test.  Sexually transmitted disease (STD) testing.  Diabetes screening. This is done by checking your blood sugar (glucose) after you have not eaten for a while (fasting). You may have this done every 1-3 years.  Bone density scan. This is done to screen for osteoporosis. You may have this done starting at age 6.  Mammogram. This may be done every 1-2 years. Talk to your health care provider about how often you should have regular mammograms. Talk with your health care provider about your test results, treatment options, and if necessary, the need for more tests. Vaccines  Your health  care provider may recommend certain vaccines, such as:  Influenza vaccine. This is recommended every year.  Tetanus, diphtheria, and acellular pertussis (Tdap, Td) vaccine. You may need a Td booster every 10 years.  Zoster vaccine. You  may need this after age 76.  Pneumococcal 13-valent conjugate (PCV13) vaccine. One dose is recommended after age 10.  Pneumococcal polysaccharide (PPSV23) vaccine. One dose is recommended after age 51. Talk to your health care provider about which screenings and vaccines you need and how often you need them. This information is not intended to replace advice given to you by your health care provider. Make sure you discuss any questions you have with your health care provider. Document Released: 07/16/2015 Document Revised: 03/08/2016 Document Reviewed: 04/20/2015 Elsevier Interactive Patient Education  2017 [place] Prevention in the Home Falls can cause injuries. They can happen to people of all ages. There are many things you can do to make your home safe and to help prevent falls. What can I do on the outside of my home?  Regularly fix the edges of walkways and driveways and fix any cracks.  Remove anything that might make you trip as you walk through a door, such as a raised step or threshold.  Trim any bushes or trees on the path to your home.  Use bright outdoor lighting.  Clear any walking paths of anything that might make someone trip, such as rocks or tools.  Regularly check to see if handrails are loose or broken. Make sure that both sides of any steps have handrails.  Any raised decks and porches should have guardrails on the edges.  Have any leaves, snow, or ice cleared regularly.  Use sand or salt on walking paths during winter.  Clean up any spills in your garage right away. This includes oil or grease spills. What can I do in the bathroom?  Use night lights.  Install grab bars by the toilet and in the tub and shower. Do not use towel bars as grab bars.  Use non-skid mats or decals in the tub or shower.  If you need to sit down in the shower, use a plastic, non-slip stool.  Keep the floor dry. Clean up any water that spills on the floor as soon as  it happens.  Remove soap buildup in the tub or shower regularly.  Attach bath mats securely with double-sided non-slip rug tape.  Do not have throw rugs and other things on the floor that can make you trip. What can I do in the bedroom?  Use night lights.  Make sure that you have a light by your bed that is easy to reach.  Do not use any sheets or blankets that are too big for your bed. They should not hang down onto the floor.  Have a firm chair that has side arms. You can use this for support while you get dressed.  Do not have throw rugs and other things on the floor that can make you trip. What can I do in the kitchen?  Clean up any spills right away.  Avoid walking on wet floors.  Keep items that you use a lot in easy-to-reach places.  If you need to reach something above you, use a strong step stool that has a grab bar.  Keep electrical cords out of the way.  Do not use floor polish or wax that makes floors slippery. If you must use wax, use non-skid floor wax.  Do not have  throw rugs and other things on the floor that can make you trip. What can I do with my stairs?  Do not leave any items on the stairs.  Make sure that there are handrails on both sides of the stairs and use them. Fix handrails that are broken or loose. Make sure that handrails are as long as the stairways.  Check any carpeting to make sure that it is firmly attached to the stairs. Fix any carpet that is loose or worn.  Avoid having throw rugs at the top or bottom of the stairs. If you do have throw rugs, attach them to the floor with carpet tape.  Make sure that you have a light switch at the top of the stairs and the bottom of the stairs. If you do not have them, ask someone to add them for you. What else can I do to help prevent falls?  Wear shoes that:  Do not have high heels.  Have rubber bottoms.  Are comfortable and fit you well.  Are closed at the toe. Do not wear sandals.  If  you use a stepladder:  Make sure that it is fully opened. Do not climb a closed stepladder.  Make sure that both sides of the stepladder are locked into place.  Ask someone to hold it for you, if possible.  Clearly mark and make sure that you can see:  Any grab bars or handrails.  First and last steps.  Where the edge of each step is.  Use tools that help you move around (mobility aids) if they are needed. These include:  Canes.  Walkers.  Scooters.  Crutches.  Turn on the lights when you go into a dark area. Replace any light bulbs as soon as they burn out.  Set up your furniture so you have a clear path. Avoid moving your furniture around.  If any of your floors are uneven, fix them.  If there are any pets around you, be aware of where they are.  Review your medicines with your doctor. Some medicines can make you feel dizzy. This can increase your chance of falling. Ask your doctor what other things that you can do to help prevent falls. This information is not intended to replace advice given to you by your health care provider. Make sure you discuss any questions you have with your health care provider. Document Released: 04/15/2009 Document Revised: 11/25/2015 Document Reviewed: 07/24/2014 Elsevier Interactive Patient Education  2017 [employer].

## 2016-11-29 NOTE — Progress Notes (Signed)
Subjective:   Brianna Stevens is a 76 y.o. female who presents for an Initial Medicare Annual Wellness Visit.   Review of Systems    Cardiac Risk Factors include: advanced age (>26men, >64 women)     Objective:    Today's Vitals   11/29/16 1029  BP: 118/70  Pulse: 74  Resp: 16  Temp: 97.8 F (36.6 C)  Weight: 146 lb 3.2 oz (66.3 kg)  Height: 5\' 3"  (1.6 m)   Body mass index is 25.9 kg/m.   Current Medications (verified) Outpatient Encounter Prescriptions as of 11/29/2016  Medication Sig  . aspirin EC 81 MG tablet Take 81 mg by mouth daily.  . Black Cohosh (BLACK COHOSH HOT FLASH RELIEF) 40 MG CAPS Take by mouth.  . calcium-vitamin D 250-100 MG-UNIT per tablet Take 1 tablet by mouth 2 (two) times daily.  . fexofenadine (ALLEGRA) 180 MG tablet Take by mouth daily as needed.  . meclizine (ANTIVERT) 25 MG tablet Take 1 tablet (25 mg total) by mouth 3 (three) times daily as needed.  . Multiple Vitamin (MULTI-VITAMINS) TABS Take by mouth.  . Omega-3 Fatty Acids (FISH OIL) 1000 MG CAPS Take by mouth.  Marland Kitchen omeprazole (PRILOSEC) 20 MG capsule Take 20 mg by mouth daily.  . simvastatin (ZOCOR) 20 MG tablet Take 1 tablet (20 mg total) by mouth daily.   No facility-administered encounter medications on file as of 11/29/2016.     Allergies (verified) Pantoprazole   History: History reviewed. No pertinent past medical history. Past Surgical History:  Procedure Laterality Date  . APPENDECTOMY    . BREAST CYST ASPIRATION Bilateral   . CHOLECYSTECTOMY     Family History  Problem Relation Age of Onset  . Prostate cancer Brother 51  . Prostate cancer Brother 35   Social History   Occupational History  . Not on file.   Social History Main Topics  . Smoking status: Never Smoker  . Smokeless tobacco: Never Used  . Alcohol use 0.6 oz/week    1 Glasses of wine per week     Comment: social  . Drug use: No  . Sexual activity: Not on file    Tobacco Counseling Counseling  given: Not Answered   Activities of Daily Living In your present state of health, do you have any difficulty performing the following activities: 11/29/2016 02/01/2016  Hearing? N N  Vision? N N  Difficulty concentrating or making decisions? N N  Walking or climbing stairs? N N  Dressing or bathing? N N  Doing errands, shopping? N N  Preparing Food and eating ? N -  Using the Toilet? N -  In the past six months, have you accidently leaked urine? N -  Do you have problems with loss of bowel control? N -  Managing your Medications? N -  Managing your Finances? N -  Housekeeping or managing your Housekeeping? N -  Some recent data might be hidden    Immunizations and Health Maintenance Immunization History  Administered Date(s) Administered  . Pneumococcal Polysaccharide-23 07/03/2010   There are no preventive care reminders to display for this patient.  Patient Care Team: Guadalupe Maple, MD as PCP - General (Family Medicine) Schermerhorn, Gwen Her, MD as Referring Physician (Obstetrics and Gynecology)  Indicate any recent Medical Services you may have received from other than [facility] providers in the past year (date may be approximate).     Assessment:   This is a routine wellness examination for [facility].  Hearing/Vision screen Vision Screening Comments: Sees Dr.Newbern annually   Dietary issues and exercise activities discussed: Current Exercise Habits: Home exercise routine, Type of exercise: walking;strength training/weights, Time (Minutes): 30, Frequency (Times/Week): 3, Weekly Exercise (Minutes/Week): 90, Intensity: Mild  Goals    . Increase water intake          Recommend to continue drinking 3-4 glasses of water a day.       Depression Screen PHQ 2/9 Scores 11/29/2016 02/01/2016  PHQ - 2 Score 0 0  PHQ- 9 Score 0 -    Fall Risk Fall Risk  11/29/2016 08/03/2016 02/01/2016  Falls in the past year? No No No    Cognitive Function:     6CIT Screen 11/29/2016  What  Year? 0 points  What month? 0 points  What time? 0 points  Count back from 20 0 points  Months in reverse 2 points  Repeat phrase 2 points  Total Score 4    Screening Tests Health Maintenance  Topic Date Due  . TETANUS/TDAP  02/02/2017 (Originally 07/20/1959)  . PNA vac Low Risk Adult (2 of 2 - PCV13) 02/02/2017 (Originally 07/04/2011)  . INFLUENZA VACCINE  01/31/2017  . DEXA SCAN  Completed      Plan:  I have personally reviewed and addressed the Medicare Annual Wellness questionnaire and have noted the following in the patient's chart:  A. Medical and social history B. Use of alcohol, tobacco or illicit drugs  C. Current medications and supplements D. Functional ability and status E.  Nutritional status F.  Physical activity G. Advance directives H. List of other physicians I.  Hospitalizations, surgeries, and ER visits in previous 12 months J.  [place] such as hearing and vision if needed, cognitive and depression L. Referrals and appointments - 02/05/2017 at 9:45am with Dr.Crissman  In addition, I have reviewed and discussed with patient certain preventive protocols, quality metrics, and best practice recommendations. A written personalized care plan for preventive services as well as general preventive health recommendations were provided to patient.   Signed,  Tyler Aas, LPN Nurse Health Advisor   MD Recommendations: none   I, as supervising physician, who reviewed the nurse health advisors Medicare wellness visit note for this patient and concur with the findings and recommendations listed above. Golden Pop M.D.

## 2016-12-26 ENCOUNTER — Other Ambulatory Visit: Payer: Self-pay | Admitting: Family Medicine

## 2016-12-26 NOTE — Telephone Encounter (Signed)
  Last routine OV: 08/03/16 Next OV: 02/05/17

## 2017-01-31 DIAGNOSIS — L820 Inflamed seborrheic keratosis: Secondary | ICD-10-CM | POA: Diagnosis not present

## 2017-01-31 DIAGNOSIS — L821 Other seborrheic keratosis: Secondary | ICD-10-CM | POA: Diagnosis not present

## 2017-01-31 DIAGNOSIS — L570 Actinic keratosis: Secondary | ICD-10-CM | POA: Diagnosis not present

## 2017-01-31 DIAGNOSIS — D485 Neoplasm of uncertain behavior of skin: Secondary | ICD-10-CM | POA: Diagnosis not present

## 2017-01-31 DIAGNOSIS — L812 Freckles: Secondary | ICD-10-CM | POA: Diagnosis not present

## 2017-01-31 DIAGNOSIS — D1800 Hemangioma unspecified site: Secondary | ICD-10-CM | POA: Diagnosis not present

## 2017-01-31 DIAGNOSIS — D229 Melanocytic nevi, unspecified: Secondary | ICD-10-CM | POA: Diagnosis not present

## 2017-01-31 DIAGNOSIS — Z85828 Personal history of other malignant neoplasm of skin: Secondary | ICD-10-CM | POA: Diagnosis not present

## 2017-02-05 ENCOUNTER — Ambulatory Visit: Payer: PPO | Admitting: Family Medicine

## 2017-02-28 ENCOUNTER — Other Ambulatory Visit: Payer: Self-pay | Admitting: Family Medicine

## 2017-02-28 DIAGNOSIS — E7800 Pure hypercholesterolemia, unspecified: Secondary | ICD-10-CM

## 2017-02-28 NOTE — Telephone Encounter (Signed)
Last OV: 08/03/16 Next OV: 04/18/17  Lab Results  Component Value Date   CHOL 186 08/03/2016   HDL 58 02/01/2016   LDLCALC 91 02/01/2016   TRIG 87 08/03/2016   CHOLHDL 3.0 02/01/2016   Lab Results  Component Value Date   CREATININE 0.95 03/10/2016   BUN 15 03/10/2016   NA 142 03/10/2016   K 4.9 03/10/2016   CL 101 03/10/2016   CO2 26 03/10/2016

## 2017-04-04 ENCOUNTER — Other Ambulatory Visit: Payer: Self-pay | Admitting: Obstetrics and Gynecology

## 2017-04-04 DIAGNOSIS — Z1231 Encounter for screening mammogram for malignant neoplasm of breast: Secondary | ICD-10-CM

## 2017-04-04 DIAGNOSIS — F5102 Adjustment insomnia: Secondary | ICD-10-CM | POA: Diagnosis not present

## 2017-04-04 DIAGNOSIS — Z1211 Encounter for screening for malignant neoplasm of colon: Secondary | ICD-10-CM | POA: Diagnosis not present

## 2017-04-04 DIAGNOSIS — Z124 Encounter for screening for malignant neoplasm of cervix: Secondary | ICD-10-CM | POA: Diagnosis not present

## 2017-04-18 ENCOUNTER — Ambulatory Visit: Payer: PPO | Admitting: Family Medicine

## 2017-04-25 ENCOUNTER — Ambulatory Visit
Admission: RE | Admit: 2017-04-25 | Discharge: 2017-04-25 | Disposition: A | Discharge status: Home / Self Care | Payer: PPO | Source: Ambulatory Visit | Attending: Obstetrics and Gynecology | Admitting: Obstetrics and Gynecology

## 2017-04-25 DIAGNOSIS — Z1231 Encounter for screening mammogram for malignant neoplasm of breast: Secondary | ICD-10-CM | POA: Diagnosis not present

## 2017-05-14 DIAGNOSIS — I83893 Varicose veins of bilateral lower extremities with other complications: Secondary | ICD-10-CM | POA: Diagnosis not present

## 2017-05-14 DIAGNOSIS — I83813 Varicose veins of bilateral lower extremities with pain: Secondary | ICD-10-CM | POA: Diagnosis not present

## 2017-05-14 DIAGNOSIS — I8312 Varicose veins of left lower extremity with inflammation: Secondary | ICD-10-CM | POA: Diagnosis not present

## 2017-05-14 DIAGNOSIS — I8311 Varicose veins of right lower extremity with inflammation: Secondary | ICD-10-CM | POA: Diagnosis not present

## 2017-05-15 ENCOUNTER — Encounter: Payer: Self-pay | Admitting: Family Medicine

## 2017-05-15 ENCOUNTER — Ambulatory Visit: Payer: PPO | Admitting: Family Medicine

## 2017-05-15 VITALS — BP 133/82 | HR 56 | Wt 144.0 lb

## 2017-05-15 DIAGNOSIS — E7800 Pure hypercholesterolemia, unspecified: Secondary | ICD-10-CM | POA: Diagnosis not present

## 2017-05-15 DIAGNOSIS — K2960 Other gastritis without bleeding: Secondary | ICD-10-CM

## 2017-05-15 DIAGNOSIS — R7309 Other abnormal glucose: Secondary | ICD-10-CM

## 2017-05-15 LAB — LP+ALT+AST PICCOLO, WAIVED
ALT (SGPT) PICCOLO, WAIVED: 13 U/L (ref 10–47)
AST (SGOT) Piccolo, Waived: 32 U/L (ref 11–38)
Chol/HDL Ratio Piccolo,Waive: 2.6 mg/dL
Cholesterol Piccolo, Waived: 167 mg/dL (ref <200)
HDL CHOL PICCOLO, WAIVED: 65 mg/dL (ref >59)
LDL CHOL CALC PICCOLO WAIVED: 87 mg/dL (ref <100)
Triglycerides Piccolo,Waived: 73 mg/dL (ref <150)
VLDL CHOL CALC PICCOLO,WAIVE: 15 mg/dL (ref <30)

## 2017-05-15 LAB — URINALYSIS, ROUTINE W REFLEX MICROSCOPIC
Bilirubin, UA: NEGATIVE
GLUCOSE, UA: NEGATIVE
KETONES UA: NEGATIVE
NITRITE UA: NEGATIVE
PROTEIN UA: NEGATIVE
SPEC GRAV UA: 1.01 (ref 1.005–1.030)
UUROB: 0.2 mg/dL (ref 0.2–1.0)
pH, UA: 6.5 (ref 5.0–7.5)

## 2017-05-15 LAB — MICROSCOPIC EXAMINATION: Bacteria, UA: NONE SEEN

## 2017-05-15 MED ORDER — SIMVASTATIN 20 MG PO TABS: 20.0000 mg | ORAL_TABLET | Freq: Every day | ORAL | 4 refills | Status: DC

## 2017-05-15 NOTE — Assessment & Plan Note (Signed)
Cont prilosec  

## 2017-05-15 NOTE — Progress Notes (Signed)
   BP 133/82   Pulse (!) 56   Wt 144 lb (65.3 kg)   SpO2 98%   BMI 25.51 kg/m    Subjective:    Patient ID: Brianna Stevens, female    DOB: 09-26-1940, 76 y.o.   MRN: [id number]  HPI: Brianna Stevens is a 76 y.o. female  Chief Complaint  Patient presents with  . Follow-up  . Hyperlipidemia    Relevant past medical, surgical, family and social history reviewed and updated as indicated. Interim medical history since our last visit reviewed. Allergies and medications reviewed and updated.  Review of Systems  Per HPI unless specifically indicated above     Objective:    BP 133/82   Pulse (!) 56   Wt 144 lb (65.3 kg)   SpO2 98%   BMI 25.51 kg/m   Wt Readings from Last 3 Encounters:  05/15/17 144 lb (65.3 kg)  11/29/16 146 lb 3.2 oz (66.3 kg)  08/03/16 148 lb (67.1 kg)    Physical Exam  Results for orders placed or performed in visit on 08/03/16  LP+ALT+AST Piccolo, Waived  Result Value Ref Range   ALT (SGPT) Piccolo, Waived 35 10 - 47 U/L   AST (SGOT) Piccolo, Waived 16 11 - 38 U/L   Cholesterol Piccolo, Waived 186 <200 mg/dL   HDL Chol Piccolo, Waived 69 >59 mg/dL   Triglycerides Piccolo,Waived 87 <150 mg/dL   Chol/HDL Ratio Piccolo,Waive 2.7 mg/dL   LDL Chol Calc Piccolo Waived 99 <100 mg/dL   VLDL Chol Calc Piccolo,Waive 17 <30 mg/dL      Assessment & Plan:   Problem List Items Addressed This Visit      Digestive   Reflux gastritis    Cont prilosec      Relevant Orders   Comprehensive metabolic panel   CBC with Differential/Platelet   TSH   Urinalysis, Routine w reflex microscopic     Other   Hypercholesteremia - Primary   Relevant Orders   LP+ALT+AST Piccolo, Waived   Comprehensive metabolic panel   Lipid panel   CBC with Differential/Platelet   TSH   Urinalysis, Routine w reflex microscopic   Elevated glucose   Relevant Orders   Comprehensive metabolic panel   CBC with Differential/Platelet   TSH   Urinalysis, Routine w reflex  microscopic       Follow up plan: No Follow-up on file.

## 2017-05-16 ENCOUNTER — Encounter: Payer: Self-pay | Admitting: Family Medicine

## 2017-05-16 DIAGNOSIS — I8311 Varicose veins of right lower extremity with inflammation: Secondary | ICD-10-CM | POA: Diagnosis not present

## 2017-05-16 DIAGNOSIS — I83813 Varicose veins of bilateral lower extremities with pain: Secondary | ICD-10-CM | POA: Diagnosis not present

## 2017-05-16 DIAGNOSIS — I83893 Varicose veins of bilateral lower extremities with other complications: Secondary | ICD-10-CM | POA: Diagnosis not present

## 2017-05-16 DIAGNOSIS — I8312 Varicose veins of left lower extremity with inflammation: Secondary | ICD-10-CM | POA: Diagnosis not present

## 2017-05-16 LAB — CBC WITH DIFFERENTIAL/PLATELET
BASOS: 0 %
Basophils Absolute: 0 10*3/uL (ref 0.0–0.2)
EOS (ABSOLUTE): 0.2 10*3/uL (ref 0.0–0.4)
Eos: 3 %
HEMOGLOBIN: 14.2 g/dL (ref 11.1–15.9)
Hematocrit: 42.7 % (ref 34.0–46.6)
IMMATURE GRANS (ABS): 0 10*3/uL (ref 0.0–0.1)
Immature Granulocytes: 0 %
LYMPHS ABS: 4 10*3/uL — AB (ref 0.7–3.1)
Lymphs: 59 %
MCH: 30.7 pg (ref 26.6–33.0)
MCHC: 33.3 g/dL (ref 31.5–35.7)
MCV: 92 fL (ref 79–97)
MONOCYTES: 8 %
Monocytes Absolute: 0.5 10*3/uL (ref 0.1–0.9)
NEUTROS ABS: 2 10*3/uL (ref 1.4–7.0)
NEUTROS PCT: 30 %
Platelets: 231 10*3/uL (ref 150–379)
RBC: 4.63 x10E6/uL (ref 3.77–5.28)
RDW: 13.4 % (ref 12.3–15.4)
WBC: 6.8 10*3/uL (ref 3.4–10.8)

## 2017-05-16 LAB — COMPREHENSIVE METABOLIC PANEL
ALK PHOS: 67 IU/L (ref 39–117)
ALT: 11 IU/L (ref 0–32)
AST: 22 IU/L (ref 0–40)
Albumin/Globulin Ratio: 1.4 (ref 1.2–2.2)
Albumin: 4.2 g/dL (ref 3.5–4.8)
BILIRUBIN TOTAL: 0.4 mg/dL (ref 0.0–1.2)
BUN/Creatinine Ratio: 20 (ref 12–28)
BUN: 17 mg/dL (ref 8–27)
CHLORIDE: 100 mmol/L (ref 96–106)
CO2: 27 mmol/L (ref 20–29)
CREATININE: 0.86 mg/dL (ref 0.57–1.00)
Calcium: 9.6 mg/dL (ref 8.7–10.3)
GFR calc Af Amer: 75 mL/min/{1.73_m2} (ref >59)
GFR calc non Af Amer: 65 mL/min/{1.73_m2} (ref >59)
GLOBULIN, TOTAL: 3 g/dL (ref 1.5–4.5)
GLUCOSE: 88 mg/dL (ref 65–99)
Potassium: 5.2 mmol/L (ref 3.5–5.2)
SODIUM: 140 mmol/L (ref 134–144)
Total Protein: 7.2 g/dL (ref 6.0–8.5)

## 2017-05-16 LAB — LIPID PANEL
CHOL/HDL RATIO: 2.8 ratio (ref 0.0–4.4)
CHOLESTEROL TOTAL: 161 mg/dL (ref 100–199)
HDL: 57 mg/dL (ref >39)
LDL Calculated: 91 mg/dL (ref 0–99)
TRIGLYCERIDES: 67 mg/dL (ref 0–149)
VLDL Cholesterol Cal: 13 mg/dL (ref 5–40)

## 2017-05-16 LAB — TSH: TSH: 3.45 u[IU]/mL (ref 0.450–4.500)

## 2017-07-16 DIAGNOSIS — I8312 Varicose veins of left lower extremity with inflammation: Secondary | ICD-10-CM | POA: Diagnosis not present

## 2017-07-16 DIAGNOSIS — I83813 Varicose veins of bilateral lower extremities with pain: Secondary | ICD-10-CM | POA: Diagnosis not present

## 2017-07-16 DIAGNOSIS — I8311 Varicose veins of right lower extremity with inflammation: Secondary | ICD-10-CM | POA: Diagnosis not present

## 2017-08-20 ENCOUNTER — Encounter: Payer: Self-pay | Admitting: Family Medicine

## 2017-08-20 DIAGNOSIS — I8312 Varicose veins of left lower extremity with inflammation: Secondary | ICD-10-CM | POA: Diagnosis not present

## 2017-08-20 DIAGNOSIS — I83812 Varicose veins of left lower extremities with pain: Secondary | ICD-10-CM | POA: Diagnosis not present

## 2017-08-20 DIAGNOSIS — I83892 Varicose veins of left lower extremities with other complications: Secondary | ICD-10-CM | POA: Diagnosis not present

## 2017-09-05 DIAGNOSIS — I83892 Varicose veins of left lower extremities with other complications: Secondary | ICD-10-CM | POA: Diagnosis not present

## 2017-09-05 DIAGNOSIS — I8312 Varicose veins of left lower extremity with inflammation: Secondary | ICD-10-CM | POA: Diagnosis not present

## 2017-09-05 DIAGNOSIS — I83812 Varicose veins of left lower extremities with pain: Secondary | ICD-10-CM | POA: Diagnosis not present

## 2017-10-03 DIAGNOSIS — I83892 Varicose veins of left lower extremities with other complications: Secondary | ICD-10-CM | POA: Diagnosis not present

## 2017-10-03 DIAGNOSIS — I8312 Varicose veins of left lower extremity with inflammation: Secondary | ICD-10-CM | POA: Diagnosis not present

## 2017-10-15 DIAGNOSIS — I8311 Varicose veins of right lower extremity with inflammation: Secondary | ICD-10-CM | POA: Diagnosis not present

## 2017-10-31 DIAGNOSIS — I8311 Varicose veins of right lower extremity with inflammation: Secondary | ICD-10-CM | POA: Diagnosis not present

## 2017-11-12 ENCOUNTER — Ambulatory Visit: Payer: PPO | Admitting: Family Medicine

## 2017-11-28 ENCOUNTER — Ambulatory Visit: Payer: PPO | Admitting: Family Medicine

## 2017-12-04 DIAGNOSIS — I83891 Varicose veins of right lower extremities with other complications: Secondary | ICD-10-CM | POA: Diagnosis not present

## 2017-12-04 DIAGNOSIS — I8311 Varicose veins of right lower extremity with inflammation: Secondary | ICD-10-CM | POA: Diagnosis not present

## 2017-12-05 ENCOUNTER — Ambulatory Visit: Payer: PPO

## 2017-12-17 ENCOUNTER — Ambulatory Visit (INDEPENDENT_AMBULATORY_CARE_PROVIDER_SITE_OTHER): Payer: PPO

## 2017-12-17 VITALS — BP 128/78 | HR 59 | Temp 98.0°F | Resp 16 | Ht 63.0 in | Wt 144.1 lb

## 2017-12-17 DIAGNOSIS — R748 Abnormal levels of other serum enzymes: Secondary | ICD-10-CM

## 2017-12-17 DIAGNOSIS — R7309 Other abnormal glucose: Secondary | ICD-10-CM

## 2017-12-17 DIAGNOSIS — Z0000 Encounter for general adult medical examination without abnormal findings: Secondary | ICD-10-CM

## 2017-12-17 DIAGNOSIS — E7800 Pure hypercholesterolemia, unspecified: Secondary | ICD-10-CM | POA: Diagnosis not present

## 2017-12-17 LAB — LP+ALT+AST PICCOLO, WAIVED
ALT (SGPT) Piccolo, Waived: 20 U/L (ref 10–47)
AST (SGOT) Piccolo, Waived: 36 U/L (ref 11–38)
CHOL/HDL RATIO PICCOLO,WAIVE: 2.4 mg/dL
Cholesterol Piccolo, Waived: 172 mg/dL (ref <200)
HDL Chol Piccolo, Waived: 71 mg/dL (ref >59)
LDL CHOL CALC PICCOLO WAIVED: 88 mg/dL (ref <100)
Triglycerides Piccolo,Waived: 72 mg/dL (ref <150)
VLDL CHOL CALC PICCOLO,WAIVE: 14 mg/dL (ref <30)

## 2017-12-17 NOTE — Patient Instructions (Signed)
Brianna Stevens , Thank you for taking time to come for your Medicare Wellness Visit. I appreciate your ongoing commitment to your health goals. Please review the following plan we discussed and let me know if I can assist you in the future.   Screening recommendations/referrals: Colonoscopy: no longer required Mammogram: completed 04/25/2017 Bone Density: completed 314/2013 Recommended yearly ophthalmology/optometry visit for glaucoma screening and checkup Recommended yearly dental visit for hygiene and checkup  Vaccinations: Influenza vaccine: due 03/2018 Pneumococcal vaccine: up to date Tdap vaccine: due, check with your insurance company for coverage Shingles vaccine: shigrix eligible, check with your insurance company for coverage    Advanced directives: Please bring a copy of your health care power of attorney and living will to the office at your convenience.  Conditions/risks identified: Recommend continue drinking at least 6-8 glasses of water a day   Next appointment: Follow up on 12/18/2017 at 10:30am with Dr.Crissman. Follow up in one year for your annual wellness exam.   Preventive Care 65 Years and Older, Female Preventive care refers to lifestyle choices and visits with your health care provider that can promote health and wellness. What does preventive care include?  A yearly physical exam. This is also called an annual well check.  Dental exams once or twice a year.  Routine eye exams. Ask your health care provider how often you should have your eyes checked.  Personal lifestyle choices, including:  Daily care of your teeth and gums.  Regular physical activity.  Eating a healthy diet.  Avoiding tobacco and drug use.  Limiting alcohol use.  Practicing safe sex.  Taking low-dose aspirin every day.  Taking vitamin and mineral supplements as recommended by your health care provider. What happens during an annual well check? The services and screenings done  by your health care provider during your annual well check will depend on your age, overall health, lifestyle risk factors, and family history of disease. Counseling  Your health care provider may ask you questions about your:  Alcohol use.  Tobacco use.  Drug use.  Emotional well-being.  Home and relationship well-being.  Sexual activity.  Eating habits.  History of falls.  Memory and ability to understand (cognition).  Work and work Statistician.  Reproductive health. Screening  You may have the following tests or measurements:  Height, weight, and BMI.  Blood pressure.  Lipid and cholesterol levels. These may be checked every 5 years, or more frequently if you are over 28 years old.  Skin check.  Lung cancer screening. You may have this screening every year starting at age 82 if you have a 30-pack-year history of smoking and currently smoke or have quit within the past 15 years.  Fecal occult blood test (FOBT) of the stool. You may have this test every year starting at age 67.  Flexible sigmoidoscopy or colonoscopy. You may have a sigmoidoscopy every 5 years or a colonoscopy every 10 years starting at age 87.  Hepatitis C blood test.  Hepatitis B blood test.  Sexually transmitted disease (STD) testing.  Diabetes screening. This is done by checking your blood sugar (glucose) after you have not eaten for a while (fasting). You may have this done every 1-3 years.  Bone density scan. This is done to screen for osteoporosis. You may have this done starting at age 63.  Mammogram. This may be done every 1-2 years. Talk to your health care provider about how often you should have regular mammograms. Talk with your health care provider  about your test results, treatment options, and if necessary, the need for more tests. Vaccines  Your health care provider may recommend certain vaccines, such as:  Influenza vaccine. This is recommended every year.  Tetanus,  diphtheria, and acellular pertussis (Tdap, Td) vaccine. You may need a Td booster every 10 years.  Zoster vaccine. You may need this after age 9.  Pneumococcal 13-valent conjugate (PCV13) vaccine. One dose is recommended after age 104.  Pneumococcal polysaccharide (PPSV23) vaccine. One dose is recommended after age 12. Talk to your health care provider about which screenings and vaccines you need and how often you need them. This information is not intended to replace advice given to you by your health care provider. Make sure you discuss any questions you have with your health care provider. Document Released: 07/16/2015 Document Revised: 03/08/2016 Document Reviewed: 04/20/2015 Elsevier Interactive Patient Education  2017 [place] Prevention in the Home Falls can cause injuries. They can happen to people of all ages. There are many things you can do to make your home safe and to help prevent falls. What can I do on the outside of my home?  Regularly fix the edges of walkways and driveways and fix any cracks.  Remove anything that might make you trip as you walk through a door, such as a raised step or threshold.  Trim any bushes or trees on the path to your home.  Use bright outdoor lighting.  Clear any walking paths of anything that might make someone trip, such as rocks or tools.  Regularly check to see if handrails are loose or broken. Make sure that both sides of any steps have handrails.  Any raised decks and porches should have guardrails on the edges.  Have any leaves, snow, or ice cleared regularly.  Use sand or salt on walking paths during winter.  Clean up any spills in your garage right away. This includes oil or grease spills. What can I do in the bathroom?  Use night lights.  Install grab bars by the toilet and in the tub and shower. Do not use towel bars as grab bars.  Use non-skid mats or decals in the tub or shower.  If you need to sit down in  the shower, use a plastic, non-slip stool.  Keep the floor dry. Clean up any water that spills on the floor as soon as it happens.  Remove soap buildup in the tub or shower regularly.  Attach bath mats securely with double-sided non-slip rug tape.  Do not have throw rugs and other things on the floor that can make you trip. What can I do in the bedroom?  Use night lights.  Make sure that you have a light by your bed that is easy to reach.  Do not use any sheets or blankets that are too big for your bed. They should not hang down onto the floor.  Have a firm chair that has side arms. You can use this for support while you get dressed.  Do not have throw rugs and other things on the floor that can make you trip. What can I do in the kitchen?  Clean up any spills right away.  Avoid walking on wet floors.  Keep items that you use a lot in easy-to-reach places.  If you need to reach something above you, use a strong step stool that has a grab bar.  Keep electrical cords out of the way.  Do not use floor polish or  wax that makes floors slippery. If you must use wax, use non-skid floor wax.  Do not have throw rugs and other things on the floor that can make you trip. What can I do with my stairs?  Do not leave any items on the stairs.  Make sure that there are handrails on both sides of the stairs and use them. Fix handrails that are broken or loose. Make sure that handrails are as long as the stairways.  Check any carpeting to make sure that it is firmly attached to the stairs. Fix any carpet that is loose or worn.  Avoid having throw rugs at the top or bottom of the stairs. If you do have throw rugs, attach them to the floor with carpet tape.  Make sure that you have a light switch at the top of the stairs and the bottom of the stairs. If you do not have them, ask someone to add them for you. What else can I do to help prevent falls?  Wear shoes that:  Do not have high  heels.  Have rubber bottoms.  Are comfortable and fit you well.  Are closed at the toe. Do not wear sandals.  If you use a stepladder:  Make sure that it is fully opened. Do not climb a closed stepladder.  Make sure that both sides of the stepladder are locked into place.  Ask someone to hold it for you, if possible.  Clearly mark and make sure that you can see:  Any grab bars or handrails.  First and last steps.  Where the edge of each step is.  Use tools that help you move around (mobility aids) if they are needed. These include:  Canes.  Walkers.  Scooters.  Crutches.  Turn on the lights when you go into a dark area. Replace any light bulbs as soon as they burn out.  Set up your furniture so you have a clear path. Avoid moving your furniture around.  If any of your floors are uneven, fix them.  If there are any pets around you, be aware of where they are.  Review your medicines with your doctor. Some medicines can make you feel dizzy. This can increase your chance of falling. Ask your doctor what other things that you can do to help prevent falls. This information is not intended to replace advice given to you by your health care provider. Make sure you discuss any questions you have with your health care provider. Document Released: 04/15/2009 Document Revised: 11/25/2015 Document Reviewed: 07/24/2014 Elsevier Interactive Patient Education  2017 [employer].

## 2017-12-17 NOTE — Progress Notes (Signed)
Subjective:   Brianna Stevens is a 77 y.o. female who presents for Medicare Annual (Subsequent) preventive examination.  Review of Systems:   Cardiac Risk Factors include: advanced age (>54men, >36 women);dyslipidemia     Objective:     Vitals: BP 128/78 (BP Location: Left Arm, Patient Position: Sitting)   Pulse (!) 59   Temp 98 F (36.7 C) (Temporal)   Resp 16   Ht 5\' 3"  (1.6 m)   Wt 144 lb 1.6 oz (65.4 kg)   SpO2 98%   BMI 25.53 kg/m   Body mass index is 25.53 kg/m.  Advanced Directives 12/17/2017 11/29/2016  Does Patient Have a Medical Advance Directive? Yes Yes  Type of Advance Directive Living will;Healthcare Power of [place];Living will  Copy of [place] in Chart? No - copy requested No - copy requested    Tobacco Social History   Tobacco Use  Smoking Status Never Smoker  Smokeless Tobacco Never Used     Counseling given: Not Answered   Clinical Intake:  Pre-visit preparation completed: Yes  Pain : No/denies pain     Nutritional Status: BMI 25 -29 Overweight Diabetes: No  How often do you need to have someone help you when you read instructions, pamphlets, or other written materials from your doctor or pharmacy?: 1 - Never What is the last grade level you completed in school?: 1 year college   Interpreter Needed?: No  Information entered by :: Tiffany Hill,LPN   Past Medical History:  Diagnosis Date  . Hyperlipidemia    Past Surgical History:  Procedure Laterality Date  . APPENDECTOMY    . BREAST CYST ASPIRATION Bilateral   . CHOLECYSTECTOMY    . VARICOSE VEIN SURGERY Bilateral    done at [facility]   Family History  Problem Relation Age of Onset  . Prostate cancer Brother 90  . Prostate cancer Brother 50  . Breast cancer Neg Hx    Social History   Socioeconomic History  . Marital status: Widowed    Spouse name: Not on file  . Number of children: Not on file  .  Years of education: Not on file  . Highest education level: Not on file  Occupational History  . Not on file  Social Needs  . Financial resource strain: Not hard at all  . Food insecurity:    Worry: Never true    Inability: Never true  . Transportation needs:    Medical: No    Non-medical: No  Tobacco Use  . Smoking status: Never Smoker  . Smokeless tobacco: Never Used  Substance and Sexual Activity  . Alcohol use: Yes    Alcohol/week: 0.6 oz    Types: 1 Glasses of wine per week    Comment: social  . Drug use: No  . Sexual activity: Not on file  Lifestyle  . Physical activity:    Days per week: 5 days    Minutes per session: 30 min  . Stress: Not at all  Relationships  . Social connections:    Talks on phone: More than three times a week    Gets together: More than three times a week    Attends religious service: More than 4 times per year    Active member of club or organization: Yes    Attends meetings of clubs or organizations: More than 4 times per year    Relationship status: Widowed  Other Topics Concern  .  Not on file  Social History Narrative   Senior citizens activities     Outpatient Encounter Medications as of 12/17/2017  Medication Sig  . aspirin EC 81 MG tablet Take 81 mg by mouth daily.  . Black Cohosh (BLACK COHOSH HOT FLASH RELIEF) 40 MG CAPS Take by mouth.  . calcium-vitamin D 250-100 MG-UNIT per tablet Take 1 tablet by mouth 2 (two) times daily.  . fexofenadine (ALLEGRA) 180 MG tablet Take by mouth daily as needed.  . meclizine (ANTIVERT) 25 MG tablet Take 1 tablet (25 mg total) by mouth 3 (three) times daily as needed.  . Multiple Vitamin (MULTI-VITAMINS) TABS Take by mouth.  . Omega-3 Fatty Acids (FISH OIL) 1000 MG CAPS Take by mouth.  Marland Kitchen omeprazole (PRILOSEC) 20 MG capsule TAKE ONE CAPSULE BY MOUTH TWICE A DAY  . simvastatin (ZOCOR) 20 MG tablet Take 1 tablet (20 mg total) daily by mouth.   No facility-administered encounter medications on file  as of 12/17/2017.     Activities of Daily Living In your present state of health, do you have any difficulty performing the following activities: 12/17/2017  Hearing? N  Vision? N  Difficulty concentrating or making decisions? N  Walking or climbing stairs? N  Dressing or bathing? N  Doing errands, shopping? N  Preparing Food and eating ? N  Using the Toilet? N  In the past six months, have you accidently leaked urine? N  Do you have problems with loss of bowel control? N  Managing your Medications? N  Managing your Finances? N  Housekeeping or managing your Housekeeping? N  Some recent data might be hidden    Patient Care Team: Guadalupe Maple, MD as PCP - General (Family Medicine) Schermerhorn, Gwen Her, MD as Referring Physician (Obstetrics and Gynecology)    Assessment:   This is a routine wellness examination for [facility].  Exercise Activities and Dietary recommendations Current Exercise Habits: Structured exercise class, Type of exercise: walking, Time (Minutes): 30, Frequency (Times/Week): 5, Weekly Exercise (Minutes/Week): 150, Intensity: Moderate, Exercise limited by: None identified  Goals    . DIET - INCREASE WATER INTAKE     Recommend continue drinking at least 6-8 glasses of water a day        Fall Risk Fall Risk  12/17/2017 05/15/2017 11/29/2016 08/03/2016 02/01/2016  Falls in the past year? No No No No No   Is the patient's home free of loose throw rugs in walkways, pet beds, electrical cords, etc?   yes      Grab bars in the bathroom? yes      Handrails on the stairs?   yes      Adequate lighting?   yes  Timed Get Up and Go performed: Completed in 8 seconds with no use of assistive devices, steady gait. No intervention needed at this time.   Depression Screen PHQ 2/9 Scores 12/17/2017 05/15/2017 11/29/2016 02/01/2016  PHQ - 2 Score 0 0 0 0  PHQ- 9 Score - - 0 -     Cognitive Function     6CIT Screen 12/17/2017 11/29/2016  What Year? 0 points 0 points  What  month? 0 points 0 points  What time? 0 points 0 points  Count back from 20 0 points 0 points  Months in reverse 0 points 2 points  Repeat phrase 4 points 2 points  Total Score 4 4    Immunization History  Administered Date(s) Administered  . Influenza, High Dose Seasonal PF 04/04/2017  . Influenza-Unspecified  04/04/2017  . Pneumococcal Conjugate-13 07/03/2009  . Pneumococcal Polysaccharide-23 07/03/2010    Qualifies for Shingles Vaccine? Yes, discussed shingrix vaccine   Screening Tests Health Maintenance  Topic Date Due  . TETANUS/TDAP  07/20/1958  . PNA vac Low Risk Adult (2 of 2 - PCV13) 07/04/2011  . INFLUENZA VACCINE  01/31/2018  . DEXA SCAN  Completed    Cancer Screenings: Lung: Low Dose CT Chest recommended if Age 53-80 years, 30 pack-year currently smoking OR have quit w/in 15years. Patient does not qualify. Breast:  Up to date on Mammogram? Yes   Up to date of Bone Density/Dexa? Yes Colorectal: no longer required  Additional Screenings: : Hepatitis C Screening: not indicated     Plan:    I have personally reviewed and addressed the Medicare Annual Wellness questionnaire and have noted the following in the patient's chart:  A. Medical and social history B. Use of alcohol, tobacco or illicit drugs  C. Current medications and supplements D. Functional ability and status E.  Nutritional status F.  Physical activity G. Advance directives H. List of other physicians I.  Hospitalizations, surgeries, and ER visits in previous 12 months J.  [place] such as hearing and vision if needed, cognitive and depression L. Referrals and appointments   In addition, I have reviewed and discussed with patient certain preventive protocols, quality metrics, and best practice recommendations. A written personalized care plan for preventive services as well as general preventive health recommendations were provided to patient.   Signed,  Tyler Aas, LPN Nurse  Health Advisor   Nurse Notes:needs refill on meclizine

## 2017-12-18 ENCOUNTER — Encounter: Payer: Self-pay | Admitting: Family Medicine

## 2017-12-18 ENCOUNTER — Ambulatory Visit (INDEPENDENT_AMBULATORY_CARE_PROVIDER_SITE_OTHER): Payer: PPO | Admitting: Family Medicine

## 2017-12-18 DIAGNOSIS — H6123 Impacted cerumen, bilateral: Secondary | ICD-10-CM

## 2017-12-18 DIAGNOSIS — E7800 Pure hypercholesterolemia, unspecified: Secondary | ICD-10-CM

## 2017-12-18 LAB — BASIC METABOLIC PANEL
BUN / CREAT RATIO: 17 (ref 12–28)
BUN: 15 mg/dL (ref 8–27)
CHLORIDE: 106 mmol/L (ref 96–106)
CO2: 24 mmol/L (ref 20–29)
Calcium: 9.7 mg/dL (ref 8.7–10.3)
Creatinine, Ser: 0.9 mg/dL (ref 0.57–1.00)
GFR calc Af Amer: 71 mL/min/{1.73_m2} (ref >59)
GFR calc non Af Amer: 61 mL/min/{1.73_m2} (ref >59)
GLUCOSE: 101 mg/dL — AB (ref 65–99)
Potassium: 5.3 mmol/L — ABNORMAL HIGH (ref 3.5–5.2)
SODIUM: 145 mmol/L — AB (ref 134–144)

## 2017-12-18 LAB — LIPID PANEL W/O CHOL/HDL RATIO
Cholesterol, Total: 168 mg/dL (ref 100–199)
HDL: 67 mg/dL (ref >39)
LDL CALC: 89 mg/dL (ref 0–99)
TRIGLYCERIDES: 62 mg/dL (ref 0–149)
VLDL Cholesterol Cal: 12 mg/dL (ref 5–40)

## 2017-12-18 MED ORDER — MECLIZINE HCL 25 MG PO TABS: 25.0000 mg | ORAL_TABLET | Freq: Three times a day (TID) | ORAL | 7 refills | Status: DC | PRN

## 2017-12-18 NOTE — Progress Notes (Signed)
BP 130/79   Pulse 68   Ht 5\' 3"  (1.6 m)   Wt 144 lb (65.3 kg)   SpO2 97%   BMI 25.51 kg/m    Subjective:    Patient ID: Brianna Stevens, female    DOB: [birth year], 77 y.o.   MRN: [id number]  HPI: Brianna Stevens is a 77 y.o. female  Chief Complaint  Patient presents with  . Follow-up  . Hypertension  Patient doing well with blood pressure no complaints taking medications without problems. Cholesterol also doing well. Patient's biggest concern is ears stopped up.  Is been ongoing for over a month.  Relevant past medical, surgical, family and social history reviewed and updated as indicated. Interim medical history since our last visit reviewed. Allergies and medications reviewed and updated.  Review of Systems  Constitutional: Negative.   Respiratory: Negative.   Cardiovascular: Negative.     Per HPI unless specifically indicated above     Objective:    BP 130/79   Pulse 68   Ht 5\' 3"  (1.6 m)   Wt 144 lb (65.3 kg)   SpO2 97%   BMI 25.51 kg/m   Wt Readings from Last 3 Encounters:  12/18/17 144 lb (65.3 kg)  12/17/17 144 lb 1.6 oz (65.4 kg)  05/15/17 144 lb (65.3 kg)    Physical Exam  Constitutional: She is oriented to person, place, and time. She appears well-developed and well-nourished.  HENT:  Head: Normocephalic and atraumatic.  Ears bilaterally blocked with cerumen removed with water and instruments revealing normal canal and TM vision tolerated procedure well  Eyes: Conjunctivae and EOM are normal.  Neck: Normal range of motion.  Cardiovascular: Normal rate, regular rhythm and normal heart sounds.  Pulmonary/Chest: Effort normal and breath sounds normal.  Musculoskeletal: Normal range of motion.  Neurological: She is alert and oriented to person, place, and time.  Skin: No erythema.  Psychiatric: She has a normal mood and affect. Her behavior is normal. Judgment and thought content normal.    Results for orders placed or performed in visit on  12/17/17  Lipid Panel w/o Chol/HDL Ratio  Result Value Ref Range   Cholesterol, Total 168 100 - 199 mg/dL   Triglycerides 62 0 - 149 mg/dL   HDL 67 >39 mg/dL   VLDL Cholesterol Cal 12 5 - 40 mg/dL   LDL Calculated 89 0 - 99 mg/dL  Basic Metabolic Panel (BMET)  Result Value Ref Range   Glucose 101 (H) 65 - 99 mg/dL   BUN 15 8 - 27 mg/dL   Creatinine, Ser 0.90 0.57 - 1.00 mg/dL   GFR calc non Af Amer 61 >59 mL/min/1.73   GFR calc Af Amer 71 >59 mL/min/1.73   BUN/Creatinine Ratio 17 12 - 28   Sodium 145 (H) 134 - 144 mmol/L   Potassium 5.3 (H) 3.5 - 5.2 mmol/L   Chloride 106 96 - 106 mmol/L   CO2 24 20 - 29 mmol/L   Calcium 9.7 8.7 - 10.3 mg/dL  LP+ALT+AST Piccolo, Waived (STAT)  Result Value Ref Range   ALT (SGPT) Piccolo, Waived 20 10 - 47 U/L   AST (SGOT) Piccolo, Waived 36 11 - 38 U/L   Cholesterol Piccolo, Waived 172 <200 mg/dL   HDL Chol Piccolo, Waived 71 >59 mg/dL   Triglycerides Piccolo,Waived 72 <150 mg/dL   Chol/HDL Ratio Piccolo,Waive 2.4 mg/dL   LDL Chol Calc Piccolo Waived 88 <100 mg/dL   VLDL Chol Calc Piccolo,Waive 14 <30 mg/dL  Assessment & Plan:   Problem List Items Addressed This Visit      Nervous and Auditory   Bilateral hearing loss due to cerumen impaction    Brianna Stevens removed with water and instruments patient tolerated procedure well        Other   Hypercholesteremia    The current medical regimen is effective;  continue present plan and medications.           Follow up plan: Return if symptoms worsen or fail to improve, for As scheduled.

## 2017-12-18 NOTE — Assessment & Plan Note (Signed)
Raymond removed with water and instruments patient tolerated procedure well

## 2017-12-18 NOTE — Assessment & Plan Note (Signed)
The current medical regimen is effective;  continue present plan and medications.  

## 2017-12-22 ENCOUNTER — Other Ambulatory Visit: Payer: Self-pay | Admitting: Family Medicine

## 2018-01-30 DIAGNOSIS — L820 Inflamed seborrheic keratosis: Secondary | ICD-10-CM | POA: Diagnosis not present

## 2018-01-30 DIAGNOSIS — L821 Other seborrheic keratosis: Secondary | ICD-10-CM | POA: Diagnosis not present

## 2018-01-30 DIAGNOSIS — D485 Neoplasm of uncertain behavior of skin: Secondary | ICD-10-CM | POA: Diagnosis not present

## 2018-01-30 DIAGNOSIS — L570 Actinic keratosis: Secondary | ICD-10-CM | POA: Diagnosis not present

## 2018-01-30 DIAGNOSIS — L578 Other skin changes due to chronic exposure to nonionizing radiation: Secondary | ICD-10-CM | POA: Diagnosis not present

## 2018-01-30 DIAGNOSIS — L812 Freckles: Secondary | ICD-10-CM | POA: Diagnosis not present

## 2018-01-30 DIAGNOSIS — Z85828 Personal history of other malignant neoplasm of skin: Secondary | ICD-10-CM | POA: Diagnosis not present

## 2018-03-14 DIAGNOSIS — I83813 Varicose veins of bilateral lower extremities with pain: Secondary | ICD-10-CM | POA: Diagnosis not present

## 2018-04-10 ENCOUNTER — Other Ambulatory Visit: Payer: Self-pay | Admitting: Obstetrics and Gynecology

## 2018-04-10 DIAGNOSIS — Z1211 Encounter for screening for malignant neoplasm of colon: Secondary | ICD-10-CM | POA: Diagnosis not present

## 2018-04-10 DIAGNOSIS — Z1239 Encounter for other screening for malignant neoplasm of breast: Secondary | ICD-10-CM | POA: Diagnosis not present

## 2018-04-10 DIAGNOSIS — Z1231 Encounter for screening mammogram for malignant neoplasm of breast: Secondary | ICD-10-CM

## 2018-04-10 DIAGNOSIS — Z124 Encounter for screening for malignant neoplasm of cervix: Secondary | ICD-10-CM | POA: Diagnosis not present

## 2018-04-18 DIAGNOSIS — Z1211 Encounter for screening for malignant neoplasm of colon: Secondary | ICD-10-CM | POA: Diagnosis not present

## 2018-05-02 ENCOUNTER — Ambulatory Visit
Admission: RE | Admit: 2018-05-02 | Discharge: 2018-05-02 | Disposition: A | Discharge status: Home / Self Care | Payer: PPO | Source: Ambulatory Visit | Attending: Obstetrics and Gynecology | Admitting: Obstetrics and Gynecology

## 2018-05-02 DIAGNOSIS — Z1231 Encounter for screening mammogram for malignant neoplasm of breast: Secondary | ICD-10-CM | POA: Insufficient documentation

## 2018-05-23 ENCOUNTER — Other Ambulatory Visit: Payer: Self-pay | Admitting: Family Medicine

## 2018-05-23 DIAGNOSIS — E7800 Pure hypercholesterolemia, unspecified: Secondary | ICD-10-CM

## 2018-05-25 IMAGING — MG MM DIGITAL SCREENING BILAT W/ TOMO W/ CAD
9 of 13 series · 9 of 29 positions shown · non-contrast
Comparison: Previous exam(s).

CLINICAL DATA: Screening.

EXAM:
2D DIGITAL SCREENING BILATERAL MAMMOGRAM WITH CAD AND ADJUNCT TOMO

[R MLO (1 of 2)]
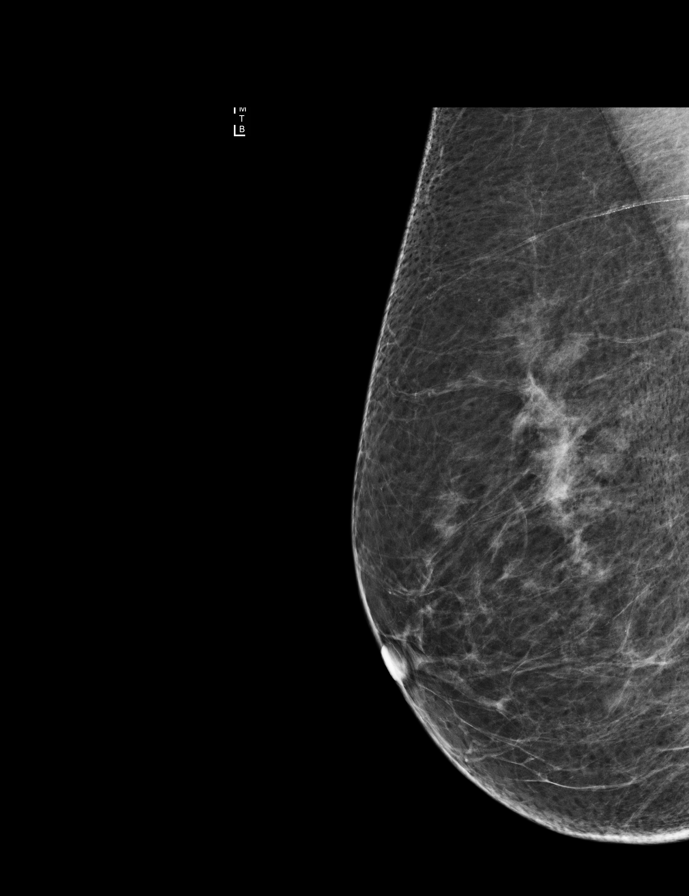

[R CC synth-2D]
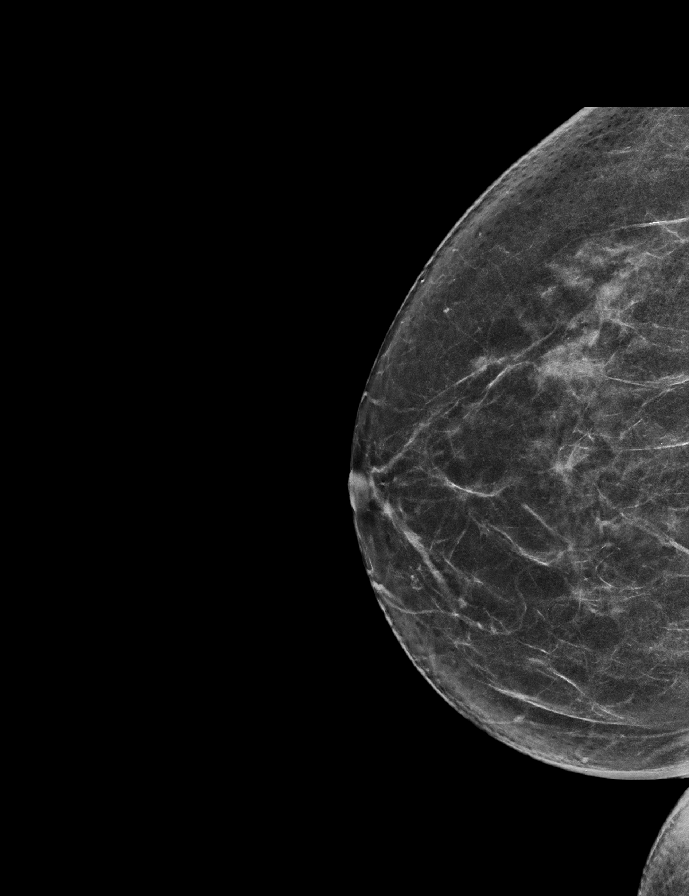

[L CC synth-2D]
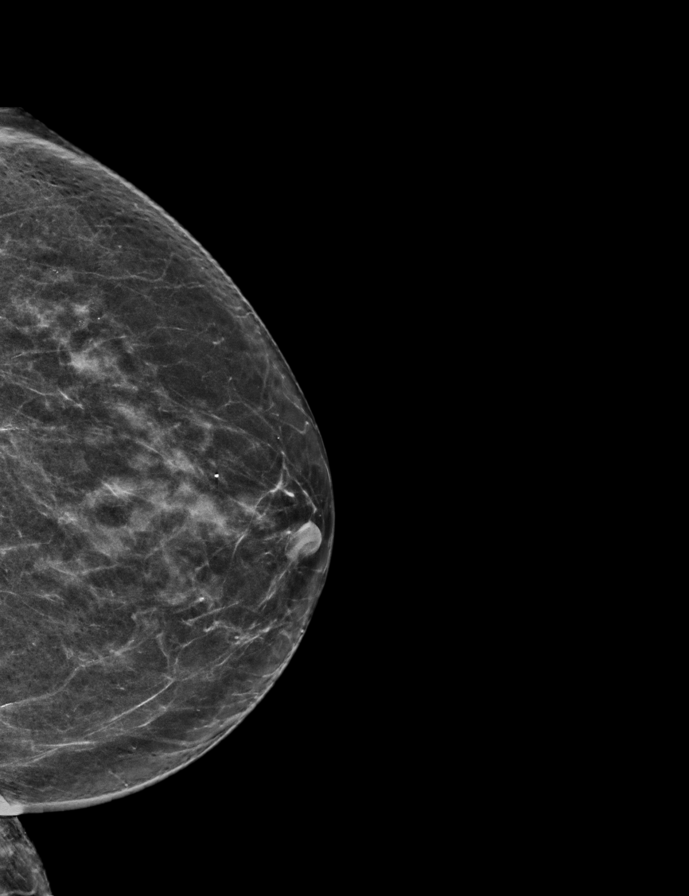

[R MLO synth-2D]
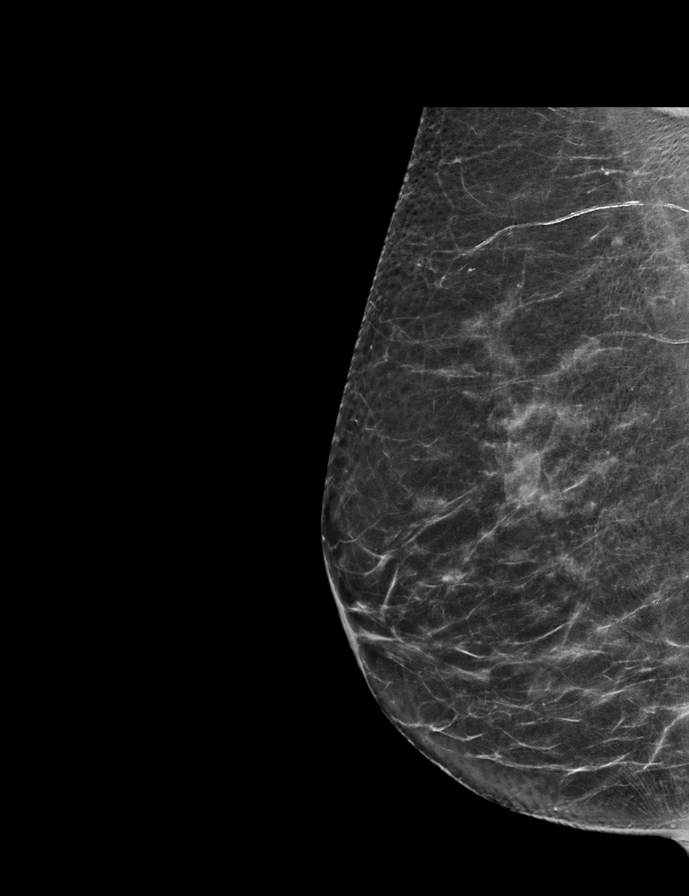

[R MLO (2 of 2)]
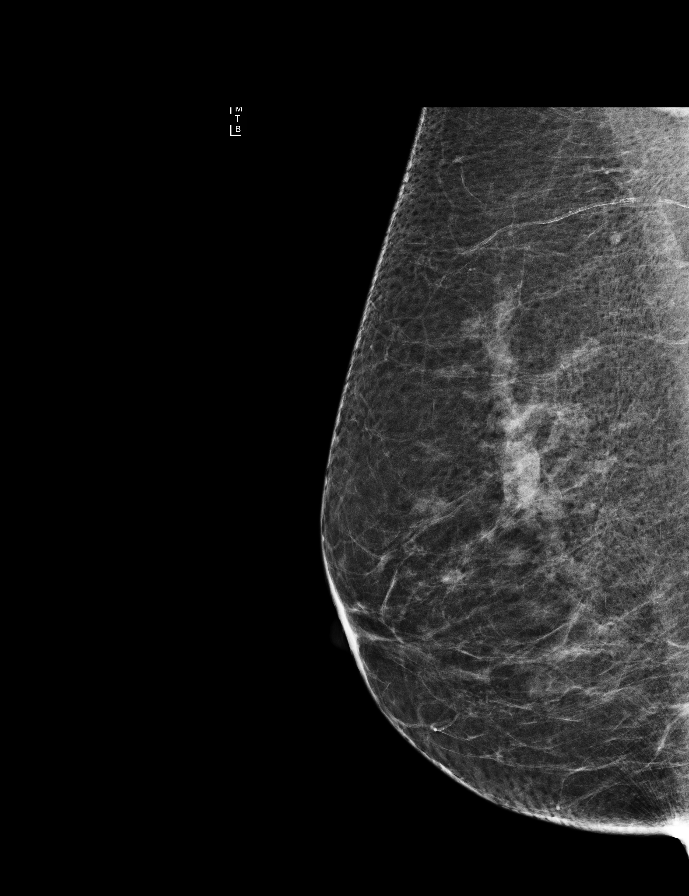

[L CC]
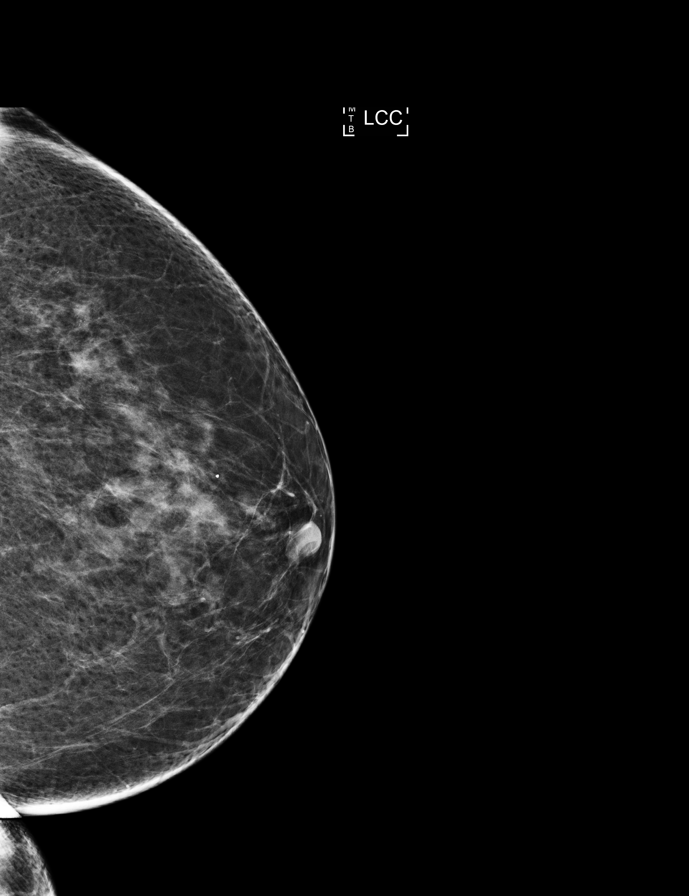

[R CC]
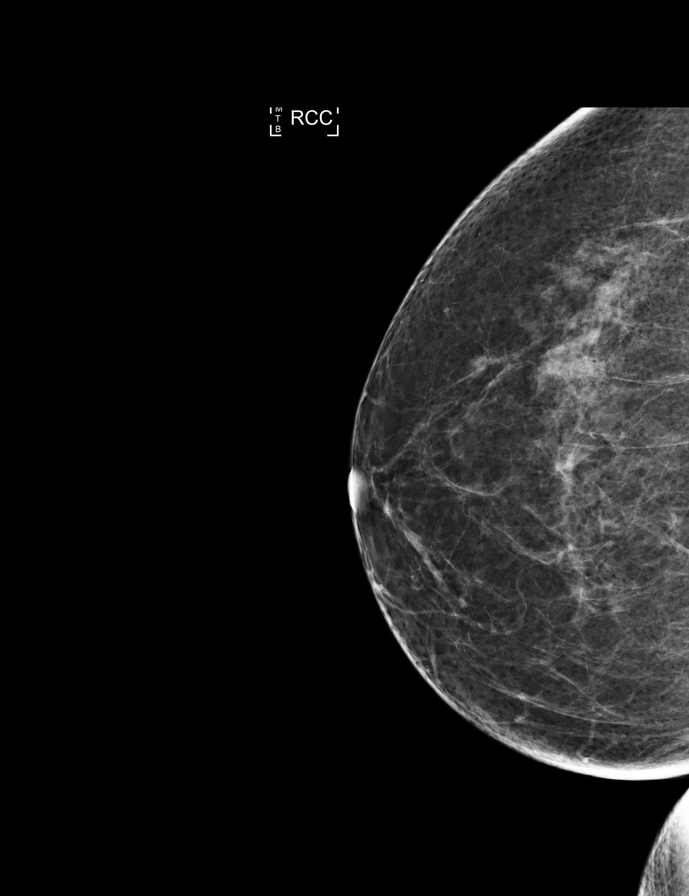

[L MLO]
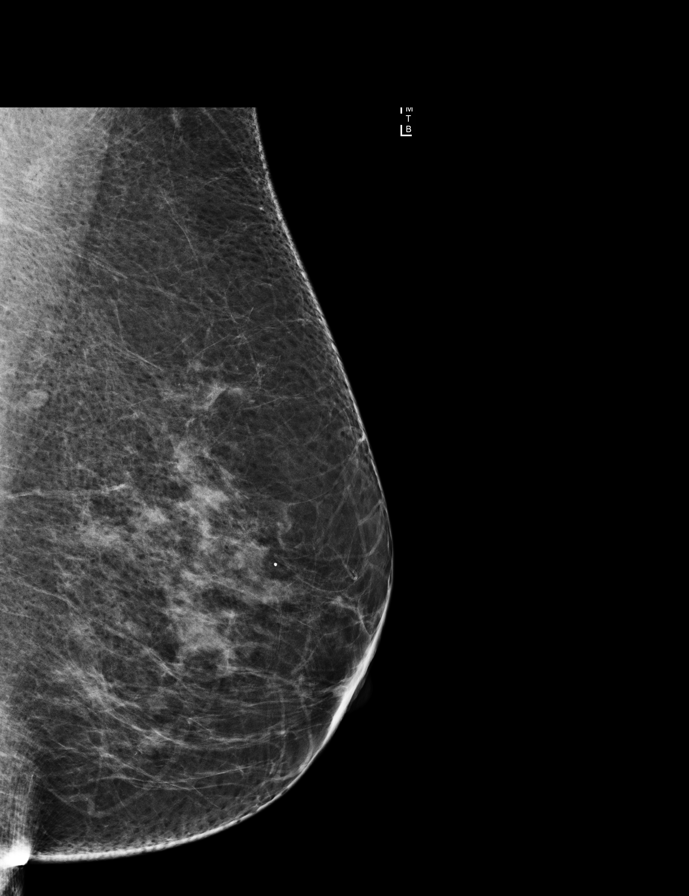

[L MLO synth-2D]
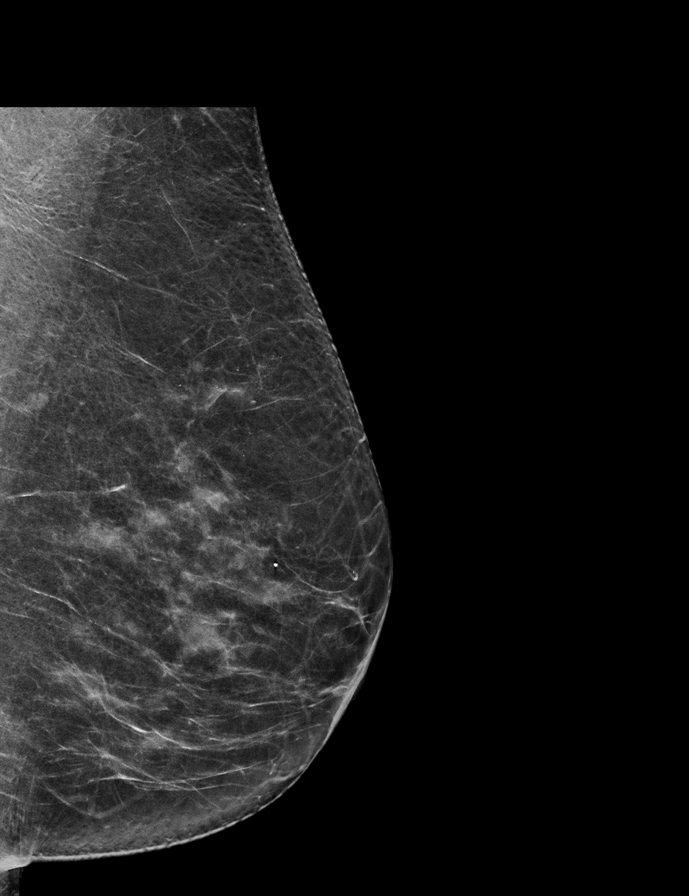

[9 of 29 positions shown; findings below may reference images not displayed]

ACR Breast Density Category b: There are scattered areas of
fibroglandular density.
FINDINGS: There are no findings suspicious for malignancy. Images were
processed with CAD.
IMPRESSION: No mammographic evidence of malignancy. A result letter of this
screening mammogram will be mailed directly to the patient.

RECOMMENDATION:
Screening mammogram in one year. (Code:97-6-RS4)

BI-RADS CATEGORY  1: Negative.

## 2018-07-08 DIAGNOSIS — J069 Acute upper respiratory infection, unspecified: Secondary | ICD-10-CM | POA: Diagnosis not present

## 2018-07-08 DIAGNOSIS — H6691 Otitis media, unspecified, right ear: Secondary | ICD-10-CM | POA: Diagnosis not present

## 2018-07-25 ENCOUNTER — Other Ambulatory Visit: Payer: Self-pay | Admitting: Family Medicine

## 2018-07-25 MED ORDER — AMOXICILLIN-POT CLAVULANATE 875-125 MG PO TABS
1.0000 | ORAL_TABLET | Freq: Two times a day (BID) | ORAL | 0 refills | Status: DC
Start: 2018-07-25 — End: 2018-12-25

## 2018-09-25 ENCOUNTER — Ambulatory Visit: Payer: PPO | Admitting: Family Medicine

## 2018-09-28 ENCOUNTER — Other Ambulatory Visit: Payer: Self-pay | Admitting: Family Medicine

## 2018-11-18 ENCOUNTER — Other Ambulatory Visit: Payer: Self-pay | Admitting: Family Medicine

## 2018-11-18 DIAGNOSIS — E7800 Pure hypercholesterolemia, unspecified: Secondary | ICD-10-CM

## 2018-12-25 ENCOUNTER — Ambulatory Visit (INDEPENDENT_AMBULATORY_CARE_PROVIDER_SITE_OTHER): Payer: PPO

## 2018-12-25 VITALS — Ht 63.0 in | Wt 145.0 lb

## 2018-12-25 DIAGNOSIS — Z0000 Encounter for general adult medical examination without abnormal findings: Secondary | ICD-10-CM

## 2018-12-25 NOTE — Progress Notes (Signed)
Subjective:   ROANNE HAYE is a 78 y.o. female who presents for Medicare Annual (Subsequent) preventive examination.  This visit is being conducted via phone call  - after an attmept to do on video chat - due to the COVID-19 pandemic. This patient has given me verbal consent via phone to conduct this visit, patient states they are participating from their home address. Some vital signs may be absent or patient reported.   Patient identification: identified by name, DOB, and current address.    Review of Systems:  Cardiac Risk Factors include: advanced age (>44men, >67 women);dyslipidemia     Objective:     Vitals: Ht 5\' 3"  (1.6 m) Comment: patient reported  Wt 145 lb (65.8 kg) Comment: patient reported  BMI 25.69 kg/m   Body mass index is 25.69 kg/m.  Advanced Directives 12/25/2018 12/17/2017 11/29/2016  Does Patient Have a Medical Advance Directive? Yes Yes Yes  Type of Advance Directive Living will;Healthcare Power of Attorney Living will;Healthcare Power of [place];Living will  Copy of [place] in Chart? No - copy requested No - copy requested No - copy requested    Tobacco Social History   Tobacco Use  Smoking Status Never Smoker  Smokeless Tobacco Never Used     Counseling given: Not Answered   Clinical Intake:  Pre-visit preparation completed: Yes  Pain : No/denies pain Pain Score: 0-No pain     Nutritional Status: BMI 25 -29 Overweight Diabetes: No  How often do you need to have someone help you when you read instructions, pamphlets, or other written materials from your doctor or pharmacy?: 1 - Never What is the last grade level you completed in school?: some college  Interpreter Needed?: No  Information entered by :: Marisela Line,LPN  Past Medical History:  Diagnosis Date  . Hyperlipidemia    Past Surgical History:  Procedure Laterality Date  . APPENDECTOMY    . BREAST CYST ASPIRATION  Bilateral   . CHOLECYSTECTOMY    . VARICOSE VEIN SURGERY Bilateral    done at [facility]   Family History  Problem Relation Age of Onset  . Prostate cancer Brother 32  . Prostate cancer Brother 80  . Breast cancer Neg Hx    Social History   Socioeconomic History  . Marital status: Widowed    Spouse name: Not on file  . Number of children: Not on file  . Years of education: 65  . Highest education level: Some college, no degree  Occupational History  . Not on file  Social Needs  . Financial resource strain: Not hard at all  . Food insecurity    Worry: Never true    Inability: Never true  . Transportation needs    Medical: No    Non-medical: No  Tobacco Use  . Smoking status: Never Smoker  . Smokeless tobacco: Never Used  Substance and Sexual Activity  . Alcohol use: Yes    Alcohol/week: 1.0 standard drinks    Types: 1 Glasses of wine per week    Comment: social  . Drug use: No  . Sexual activity: Not on file  Lifestyle  . Physical activity    Days per week: 7 days    Minutes per session: 30 min  . Stress: Not at all  Relationships  . Social connections    Talks on phone: More than three times a week    Gets together: More than three times a  week    Attends religious service: More than 4 times per year    Active member of club or organization: Yes    Attends meetings of clubs or organizations: More than 4 times per year    Relationship status: Widowed  Other Topics Concern  . Not on file  Social History Narrative   Senior citizens activities     Outpatient Encounter Medications as of 12/25/2018  Medication Sig  . aspirin EC 81 MG tablet Take 81 mg by mouth daily.  . Black Cohosh (BLACK COHOSH HOT FLASH RELIEF) 40 MG CAPS Take by mouth.  . calcium-vitamin D 250-100 MG-UNIT per tablet Take 1 tablet by mouth 2 (two) times daily.  . meclizine (ANTIVERT) 25 MG tablet Take 1 tablet (25 mg total) by mouth 3 (three) times daily as needed.  .  Multiple Vitamin (MULTI-VITAMINS) TABS Take by mouth.  . Omega-3 Fatty Acids (FISH OIL) 1000 MG CAPS Take by mouth.  Marland Kitchen omeprazole (PRILOSEC) 20 MG capsule TAKE ONE CAPSULE BY MOUTH TWICE A DAY  . simvastatin (ZOCOR) 20 MG tablet TAKE 1 TABLET (20 MG TOTAL) DAILY BY MOUTH.  . [DISCONTINUED] amoxicillin-clavulanate (AUGMENTIN) 875-125 MG tablet Take 1 tablet by mouth 2 (two) times daily. (Patient not taking: Reported on 12/25/2018)   No facility-administered encounter medications on file as of 12/25/2018.     Activities of Daily Living In your present state of health, do you have any difficulty performing the following activities: 12/25/2018  Hearing? N  Vision? N  Difficulty concentrating or making decisions? N  Walking or climbing stairs? N  Dressing or bathing? N  Doing errands, shopping? N  Preparing Food and eating ? N  Using the Toilet? N  In the past six months, have you accidently leaked urine? N  Do you have problems with loss of bowel control? N  Managing your Medications? N  Managing your Finances? N  Housekeeping or managing your Housekeeping? N  Some recent data might be hidden    Patient Care Team: Guadalupe Maple, MD as PCP - General (Family Medicine) Schermerhorn, Gwen Her, MD as Referring Physician (Obstetrics and Gynecology)    Assessment:   This is a routine wellness examination for [facility].  Exercise Activities and Dietary recommendations Current Exercise Habits: Home exercise routine, Type of exercise: walking;strength training/weights, Time (Minutes): 30, Frequency (Times/Week): 7, Weekly Exercise (Minutes/Week): 210, Intensity: Mild, Exercise limited by: None identified  Goals    . DIET - INCREASE WATER INTAKE     Recommend continue drinking at least 6-8 glasses of water a day        Fall Risk: Fall Risk  12/25/2018 12/17/2017 05/15/2017 11/29/2016 08/03/2016  Falls in the past year? 0 No No No No    FALL RISK PREVENTION PERTAINING TO THE HOME:  Any  stairs in or around the home? Yes  If so, are there any without handrails? No   Home free of loose throw rugs in walkways, pet beds, electrical cords, etc? Yes  Adequate lighting in your home to reduce risk of falls? Yes   ASSISTIVE DEVICES UTILIZED TO PREVENT FALLS:  Life alert? No  Use of a cane, walker or w/c? No  Grab bars in the bathroom? yes Shower chair or bench in shower? yes Elevated toilet seat or a handicapped toilet? yes  DME ORDERS:  DME order needed?  No   TIMED UP AND GO:  Unable to perform   Depression Screen [facility] 2/9 Scores 12/25/2018 12/17/2017 05/15/2017 11/29/2016  PHQ - 2 Score 0 0 0 0  PHQ- 9 Score - - - 0     Cognitive Function     6CIT Screen 12/25/2018 12/17/2017 11/29/2016  What Year? 0 points 0 points 0 points  What month? 0 points 0 points 0 points  What time? 0 points 0 points 0 points  Count back from 20 0 points 0 points 0 points  Months in reverse 0 points 0 points 2 points  Repeat phrase 0 points 4 points 2 points  Total Score 0 4 4    Immunization History  Administered Date(s) Administered  . Influenza, High Dose Seasonal PF 04/04/2017  . Influenza-Unspecified 04/04/2017  . Pneumococcal Conjugate-13 07/03/2009  . Pneumococcal Polysaccharide-23 07/03/2010    Qualifies for Shingles Vaccine? Yes  Zostavax completed in the last 2-3 years. Due for Shingrix. Education has been provided regarding the importance of this vaccine. Pt has been advised to call insurance company to determine out of pocket expense. Advised may also receive vaccine at local pharmacy or Health Dept. Verbalized acceptance and understanding.  Tdap: Discussed need for TD/TDAP vaccine, patient verbalized understanding that this is not covered as a preventative with there insurance and to call the office if she develops any new skin injuries, ie: cuts, scrapes, bug bites, or open wounds.  Flu Vaccine: up to date   Pneumococcal Vaccine: up to date   Screening Tests  Health Maintenance  Topic Date Due  . TETANUS/TDAP  07/20/1958  . INFLUENZA VACCINE  02/01/2019  . DEXA SCAN  Completed  . PNA vac Low Risk Adult  Completed    Cancer Screenings:  Colorectal Screening:scheduled for consultation in July.  Mammogram: Completed 05/02/2018, repeat in 1 year   Bone Density: Completed 09/14/2011.   Lung Cancer Screening: (Low Dose CT Chest recommended if Age 109-80 years, 30 pack-year currently smoking OR have quit w/in 15years.) does not qualify.    Additional Screening:  Hepatitis C Screening: does not qualify  Vision Screening: Recommended annual ophthalmology exams for early detection of glaucoma and other disorders of the eye. Is the patient up to date with their annual eye exam?  Yes  Who is the provider or what is the name of the office in which the pt attends annual eye exams? [facility]   Dental Screening: Recommended annual dental exams for proper oral hygiene  Community Resource Referral:  CRR required this visit?  No       Plan:  I have personally reviewed and addressed the Medicare Annual Wellness questionnaire and have noted the following in the patient's chart:  A. Medical and social history B. Use of alcohol, tobacco or illicit drugs  C. Current medications and supplements D. Functional ability and status E.  Nutritional status F.  Physical activity G. Advance directives H. List of other physicians I.  Hospitalizations, surgeries, and ER visits in previous 12 months J.  [place] such as hearing and vision if needed, cognitive and depression L. Referrals and appointments   In addition, I have reviewed and discussed with patient certain preventive protocols, quality metrics, and best practice recommendations. A written personalized care plan for preventive services as well as general preventive health recommendations were provided to patient. Nurse Health Advisor  Signed,    Alanreed, Caro Hight, [place]  [id number]  Nurse Health Advisor   Nurse Notes: none

## 2018-12-25 NOTE — Patient Instructions (Signed)
Brianna Stevens , Thank you for taking time to come for your Medicare Wellness Visit. I appreciate your ongoing commitment to your health goals. Please review the following plan we discussed and let me know if I can assist you in the future.   Screening recommendations/referrals: Colonoscopy: consultation scheduled for 01/2019 Mammogram: completed 05/02/2018, due 05/2019 Bone Density: completed 2013 Recommended yearly ophthalmology/optometry visit for glaucoma screening and checkup Recommended yearly dental visit for hygiene and checkup  Vaccinations: Influenza vaccine: up to date Pneumococcal vaccine: up to date Tdap vaccine: due, check with your insurance company for coverage Shingles vaccine: check with your pharmacy to see which shingles vaccine you received     Advanced directives: Please bring a copy of your health care power of attorney and living will to the office at your convenience.  Conditions/risks identified: none  Next appointment: Follow up in one year for your annual wellness exam.    Preventive Care 65 Years and Older, Female Preventive care refers to lifestyle choices and visits with your health care provider that can promote health and wellness. What does preventive care include?  A yearly physical exam. This is also called an annual well check.  Dental exams once or twice a year.  Routine eye exams. Ask your health care provider how often you should have your eyes checked.  Personal lifestyle choices, including:  Daily care of your teeth and gums.  Regular physical activity.  Eating a healthy diet.  Avoiding tobacco and drug use.  Limiting alcohol use.  Practicing safe sex.  Taking low-dose aspirin every day.  Taking vitamin and mineral supplements as recommended by your health care provider. What happens during an annual well check? The services and screenings done by your health care provider during your annual well check will depend on your age,  overall health, lifestyle risk factors, and family history of disease. Counseling  Your health care provider may ask you questions about your:  Alcohol use.  Tobacco use.  Drug use.  Emotional well-being.  Home and relationship well-being.  Sexual activity.  Eating habits.  History of falls.  Memory and ability to understand (cognition).  Work and work Statistician.  Reproductive health. Screening  You may have the following tests or measurements:  Height, weight, and BMI.  Blood pressure.  Lipid and cholesterol levels. These may be checked every 5 years, or more frequently if you are over 5 years old.  Skin check.  Lung cancer screening. You may have this screening every year starting at age 14 if you have a 30-pack-year history of smoking and currently smoke or have quit within the past 15 years.  Fecal occult blood test (FOBT) of the stool. You may have this test every year starting at age 73.  Flexible sigmoidoscopy or colonoscopy. You may have a sigmoidoscopy every 5 years or a colonoscopy every 10 years starting at age 69.  Hepatitis C blood test.  Hepatitis B blood test.  Sexually transmitted disease (STD) testing.  Diabetes screening. This is done by checking your blood sugar (glucose) after you have not eaten for a while (fasting). You may have this done every 1-3 years.  Bone density scan. This is done to screen for osteoporosis. You may have this done starting at age 23.  Mammogram. This may be done every 1-2 years. Talk to your health care provider about how often you should have regular mammograms. Talk with your health care provider about your test results, treatment options, and if necessary, the need  for more tests. Vaccines  Your health care provider may recommend certain vaccines, such as:  Influenza vaccine. This is recommended every year.  Tetanus, diphtheria, and acellular pertussis (Tdap, Td) vaccine. You may need a Td booster every 10  years.  Zoster vaccine. You may need this after age 31.  Pneumococcal 13-valent conjugate (PCV13) vaccine. One dose is recommended after age 84.  Pneumococcal polysaccharide (PPSV23) vaccine. One dose is recommended after age 53. Talk to your health care provider about which screenings and vaccines you need and how often you need them. This information is not intended to replace advice given to you by your health care provider. Make sure you discuss any questions you have with your health care provider. Document Released: 07/16/2015 Document Revised: 03/08/2016 Document Reviewed: 04/20/2015 Elsevier Interactive Patient Education  2017 [place] Prevention in the Home Falls can cause injuries. They can happen to people of all ages. There are many things you can do to make your home safe and to help prevent falls. What can I do on the outside of my home?  Regularly fix the edges of walkways and driveways and fix any cracks.  Remove anything that might make you trip as you walk through a door, such as a raised step or threshold.  Trim any bushes or trees on the path to your home.  Use bright outdoor lighting.  Clear any walking paths of anything that might make someone trip, such as rocks or tools.  Regularly check to see if handrails are loose or broken. Make sure that both sides of any steps have handrails.  Any raised decks and porches should have guardrails on the edges.  Have any leaves, snow, or ice cleared regularly.  Use sand or salt on walking paths during winter.  Clean up any spills in your garage right away. This includes oil or grease spills. What can I do in the bathroom?  Use night lights.  Install grab bars by the toilet and in the tub and shower. Do not use towel bars as grab bars.  Use non-skid mats or decals in the tub or shower.  If you need to sit down in the shower, use a plastic, non-slip stool.  Keep the floor dry. Clean up any water that  spills on the floor as soon as it happens.  Remove soap buildup in the tub or shower regularly.  Attach bath mats securely with double-sided non-slip rug tape.  Do not have throw rugs and other things on the floor that can make you trip. What can I do in the bedroom?  Use night lights.  Make sure that you have a light by your bed that is easy to reach.  Do not use any sheets or blankets that are too big for your bed. They should not hang down onto the floor.  Have a firm chair that has side arms. You can use this for support while you get dressed.  Do not have throw rugs and other things on the floor that can make you trip. What can I do in the kitchen?  Clean up any spills right away.  Avoid walking on wet floors.  Keep items that you use a lot in easy-to-reach places.  If you need to reach something above you, use a strong step stool that has a grab bar.  Keep electrical cords out of the way.  Do not use floor polish or wax that makes floors slippery. If you must use wax, use  non-skid floor wax.  Do not have throw rugs and other things on the floor that can make you trip. What can I do with my stairs?  Do not leave any items on the stairs.  Make sure that there are handrails on both sides of the stairs and use them. Fix handrails that are broken or loose. Make sure that handrails are as long as the stairways.  Check any carpeting to make sure that it is firmly attached to the stairs. Fix any carpet that is loose or worn.  Avoid having throw rugs at the top or bottom of the stairs. If you do have throw rugs, attach them to the floor with carpet tape.  Make sure that you have a light switch at the top of the stairs and the bottom of the stairs. If you do not have them, ask someone to add them for you. What else can I do to help prevent falls?  Wear shoes that:  Do not have high heels.  Have rubber bottoms.  Are comfortable and fit you well.  Are closed at the  toe. Do not wear sandals.  If you use a stepladder:  Make sure that it is fully opened. Do not climb a closed stepladder.  Make sure that both sides of the stepladder are locked into place.  Ask someone to hold it for you, if possible.  Clearly mark and make sure that you can see:  Any grab bars or handrails.  First and last steps.  Where the edge of each step is.  Use tools that help you move around (mobility aids) if they are needed. These include:  Canes.  Walkers.  Scooters.  Crutches.  Turn on the lights when you go into a dark area. Replace any light bulbs as soon as they burn out.  Set up your furniture so you have a clear path. Avoid moving your furniture around.  If any of your floors are uneven, fix them.  If there are any pets around you, be aware of where they are.  Review your medicines with your doctor. Some medicines can make you feel dizzy. This can increase your chance of falling. Ask your doctor what other things that you can do to help prevent falls. This information is not intended to replace advice given to you by your health care provider. Make sure you discuss any questions you have with your health care provider. Document Released: 04/15/2009 Document Revised: 11/25/2015 Document Reviewed: 07/24/2014 Elsevier Interactive Patient Education  2017 [employer].

## 2018-12-29 ENCOUNTER — Other Ambulatory Visit: Payer: Self-pay | Admitting: Family Medicine

## 2018-12-29 NOTE — Telephone Encounter (Signed)
Requested Prescriptions  Pending Prescriptions Disp Refills  . omeprazole (PRILOSEC) 20 MG capsule [Pharmacy Med Name: OMEPRAZOLE DR 20 MG CAPSULE] 180 capsule 3    Sig: TAKE ONE CAPSULE BY MOUTH TWICE A DAY     Gastroenterology: Proton Pump Inhibitors Passed - 12/29/2018  9:23 AM      Passed - Valid encounter within last 12 months    Recent Outpatient Visits          1 year ago Bilateral hearing loss due to cerumen impaction   [facility], Jeannette How, MD   1 year ago Hypercholesteremia   [facility] [place], Jeannette How, MD   2 years ago [place], Jeannette How, MD   2 years ago [place], Jeannette How, MD   3 years ago Hypercholesteremia   [facility] [place], Jeannette How, MD

## 2018-12-30 ENCOUNTER — Telehealth: Payer: Self-pay

## 2018-12-30 ENCOUNTER — Other Ambulatory Visit: Payer: Self-pay | Admitting: Family Medicine

## 2018-12-30 DIAGNOSIS — R7309 Other abnormal glucose: Secondary | ICD-10-CM

## 2018-12-30 DIAGNOSIS — E7800 Pure hypercholesterolemia, unspecified: Secondary | ICD-10-CM

## 2018-12-30 DIAGNOSIS — K2960 Other gastritis without bleeding: Secondary | ICD-10-CM

## 2018-12-30 DIAGNOSIS — R748 Abnormal levels of other serum enzymes: Secondary | ICD-10-CM

## 2018-12-30 NOTE — Telephone Encounter (Signed)
Copied from [place] [id number]. Topic: General - Other >> Dec 30, 2018  9:32 AM Rutherford Nail, NT wrote: Reason for CRM: Patient calling and states that she just spoke with Dr Jeananne Rama and was told to have his staff remind him to order some labs for her. Please advise.

## 2018-12-30 NOTE — Telephone Encounter (Signed)
Patient notified. Lab appt scheduled for tomorrow morning.

## 2018-12-30 NOTE — Telephone Encounter (Signed)
done

## 2018-12-30 NOTE — Telephone Encounter (Signed)
Routing to provider to advise labs.

## 2018-12-31 ENCOUNTER — Other Ambulatory Visit: Payer: PPO

## 2018-12-31 ENCOUNTER — Other Ambulatory Visit: Payer: Self-pay

## 2018-12-31 DIAGNOSIS — K2960 Other gastritis without bleeding: Secondary | ICD-10-CM | POA: Diagnosis not present

## 2018-12-31 DIAGNOSIS — R7309 Other abnormal glucose: Secondary | ICD-10-CM | POA: Diagnosis not present

## 2018-12-31 DIAGNOSIS — R748 Abnormal levels of other serum enzymes: Secondary | ICD-10-CM

## 2018-12-31 DIAGNOSIS — E7800 Pure hypercholesterolemia, unspecified: Secondary | ICD-10-CM

## 2018-12-31 LAB — URINALYSIS, ROUTINE W REFLEX MICROSCOPIC
Bilirubin, UA: NEGATIVE
Glucose, UA: NEGATIVE
Ketones, UA: NEGATIVE
Nitrite, UA: NEGATIVE
Protein,UA: NEGATIVE
Specific Gravity, UA: 1.01 (ref 1.005–1.030)
Urobilinogen, Ur: 0.2 mg/dL (ref 0.2–1.0)
pH, UA: 5.5 (ref 5.0–7.5)

## 2018-12-31 LAB — MICROSCOPIC EXAMINATION

## 2019-01-01 LAB — LIPID PANEL
Chol/HDL Ratio: 3.2 ratio (ref 0.0–4.4)
Cholesterol, Total: 187 mg/dL (ref 100–199)
HDL: 59 mg/dL (ref >39)
LDL Calculated: 110 mg/dL — ABNORMAL HIGH (ref 0–99)
Triglycerides: 90 mg/dL (ref 0–149)
VLDL Cholesterol Cal: 18 mg/dL (ref 5–40)

## 2019-01-01 LAB — COMPREHENSIVE METABOLIC PANEL
ALT: 14 IU/L (ref 0–32)
AST: 21 IU/L (ref 0–40)
Albumin/Globulin Ratio: 1.6 (ref 1.2–2.2)
Albumin: 4.5 g/dL (ref 3.7–4.7)
Alkaline Phosphatase: 62 IU/L (ref 39–117)
BUN/Creatinine Ratio: 20 (ref 12–28)
BUN: 18 mg/dL (ref 8–27)
Bilirubin Total: 0.5 mg/dL (ref 0.0–1.2)
CO2: 27 mmol/L (ref 20–29)
Calcium: 9.8 mg/dL (ref 8.7–10.3)
Chloride: 103 mmol/L (ref 96–106)
Creatinine, Ser: 0.89 mg/dL (ref 0.57–1.00)
GFR calc Af Amer: 71 mL/min/{1.73_m2} (ref >59)
GFR calc non Af Amer: 62 mL/min/{1.73_m2} (ref >59)
Globulin, Total: 2.8 g/dL (ref 1.5–4.5)
Glucose: 94 mg/dL (ref 65–99)
Potassium: 5.1 mmol/L (ref 3.5–5.2)
Sodium: 142 mmol/L (ref 134–144)
Total Protein: 7.3 g/dL (ref 6.0–8.5)

## 2019-01-01 LAB — CBC WITH DIFFERENTIAL/PLATELET
Basophils Absolute: 0 10*3/uL (ref 0.0–0.2)
Basos: 1 %
EOS (ABSOLUTE): 0.2 10*3/uL (ref 0.0–0.4)
Eos: 3 %
Hematocrit: 43.9 % (ref 34.0–46.6)
Hemoglobin: 14.6 g/dL (ref 11.1–15.9)
Immature Grans (Abs): 0 10*3/uL (ref 0.0–0.1)
Immature Granulocytes: 0 %
Lymphocytes Absolute: 4.5 10*3/uL — ABNORMAL HIGH (ref 0.7–3.1)
Lymphs: 61 %
MCH: 30.2 pg (ref 26.6–33.0)
MCHC: 33.3 g/dL (ref 31.5–35.7)
MCV: 91 fL (ref 79–97)
Monocytes Absolute: 0.6 10*3/uL (ref 0.1–0.9)
Monocytes: 8 %
Neutrophils Absolute: 2 10*3/uL (ref 1.4–7.0)
Neutrophils: 27 %
Platelets: 224 10*3/uL (ref 150–450)
RBC: 4.84 x10E6/uL (ref 3.77–5.28)
RDW: 12.4 % (ref 11.7–15.4)
WBC: 7.4 10*3/uL (ref 3.4–10.8)

## 2019-01-01 LAB — TSH: TSH: 4.4 u[IU]/mL (ref 0.450–4.500)

## 2019-01-30 ENCOUNTER — Other Ambulatory Visit: Payer: Self-pay

## 2019-04-24 ENCOUNTER — Other Ambulatory Visit: Payer: Self-pay | Admitting: Obstetrics and Gynecology

## 2019-04-24 DIAGNOSIS — Z01419 Encounter for gynecological examination (general) (routine) without abnormal findings: Secondary | ICD-10-CM | POA: Diagnosis not present

## 2019-04-24 DIAGNOSIS — Z124 Encounter for screening for malignant neoplasm of cervix: Secondary | ICD-10-CM | POA: Diagnosis not present

## 2019-04-24 DIAGNOSIS — Z1211 Encounter for screening for malignant neoplasm of colon: Secondary | ICD-10-CM | POA: Diagnosis not present

## 2019-04-24 DIAGNOSIS — Z23 Encounter for immunization: Secondary | ICD-10-CM | POA: Diagnosis not present

## 2019-04-24 DIAGNOSIS — Z1231 Encounter for screening mammogram for malignant neoplasm of breast: Secondary | ICD-10-CM

## 2019-05-13 DIAGNOSIS — K219 Gastro-esophageal reflux disease without esophagitis: Secondary | ICD-10-CM | POA: Diagnosis not present

## 2019-05-13 DIAGNOSIS — Z86010 Personal history of colonic polyps: Secondary | ICD-10-CM | POA: Diagnosis not present

## 2019-05-13 DIAGNOSIS — Z800 Family history of malignant neoplasm of digestive organs: Secondary | ICD-10-CM | POA: Diagnosis not present

## 2019-05-23 ENCOUNTER — Other Ambulatory Visit: Payer: Self-pay

## 2019-06-02 DIAGNOSIS — Z1211 Encounter for screening for malignant neoplasm of colon: Secondary | ICD-10-CM | POA: Diagnosis not present

## 2019-07-11 DIAGNOSIS — I83893 Varicose veins of bilateral lower extremities with other complications: Secondary | ICD-10-CM | POA: Diagnosis not present

## 2019-07-11 DIAGNOSIS — I8311 Varicose veins of right lower extremity with inflammation: Secondary | ICD-10-CM | POA: Diagnosis not present

## 2019-07-11 DIAGNOSIS — I8312 Varicose veins of left lower extremity with inflammation: Secondary | ICD-10-CM | POA: Diagnosis not present

## 2019-07-14 ENCOUNTER — Other Ambulatory Visit: Payer: Self-pay | Admitting: Family Medicine

## 2019-07-14 DIAGNOSIS — E7800 Pure hypercholesterolemia, unspecified: Secondary | ICD-10-CM

## 2019-07-14 MED ORDER — SIMVASTATIN 20 MG PO TABS: 20.0000 mg | ORAL_TABLET | Freq: Every day | ORAL | 0 refills | Status: DC

## 2019-07-14 NOTE — Telephone Encounter (Signed)
Former pt of Dr Jeananne Rama who has not established care with another provider. Sending to office P[facility] to review.

## 2019-07-14 NOTE — Telephone Encounter (Signed)
Letter sent to the patient that she is overdue for an appointment. Routing to provider for med refill.

## 2019-07-14 NOTE — Telephone Encounter (Signed)
Medication: simvastatin (ZOCOR) 20 MG tablet [id number]   Has the patient contacted their pharmacy? Yes  (Agent: If no, request that the patient contact the pharmacy for the refill.) (Agent: If yes, when and what did the pharmacy advise?)  Preferred Pharmacy (with phone number or street name): [id number] - [place], [address]  Phone:  [phone] Fax:  [phone]     Agent: Please be advised that RX refills may take up to 3 business days. We ask that you follow-up with your pharmacy.

## 2019-07-16 ENCOUNTER — Ambulatory Visit
Admission: RE | Admit: 2019-07-16 | Discharge: 2019-07-16 | Disposition: A | Discharge status: Home / Self Care | Payer: PPO | Source: Ambulatory Visit | Attending: Obstetrics and Gynecology | Admitting: Obstetrics and Gynecology

## 2019-07-16 DIAGNOSIS — Z1231 Encounter for screening mammogram for malignant neoplasm of breast: Secondary | ICD-10-CM | POA: Diagnosis not present

## 2019-08-07 ENCOUNTER — Other Ambulatory Visit: Payer: Self-pay | Admitting: Family Medicine

## 2019-08-07 DIAGNOSIS — E7800 Pure hypercholesterolemia, unspecified: Secondary | ICD-10-CM

## 2019-08-29 ENCOUNTER — Ambulatory Visit: Payer: PPO | Attending: Internal Medicine

## 2019-08-29 DIAGNOSIS — Z23 Encounter for immunization: Secondary | ICD-10-CM | POA: Insufficient documentation

## 2019-08-29 NOTE — Progress Notes (Signed)
   Covid-19 Vaccination Clinic  Name:  Brianna Stevens    MRN: [id number] DOB: 1941-06-07  08/29/2019  Brianna Stevens was observed post Covid-19 immunization for 15 minutes without incidence. She was provided with Vaccine Information Sheet and instruction to access the V-Safe system.   Brianna Stevens was instructed to call 911 with any severe reactions post vaccine: Marland Kitchen Difficulty breathing  . Swelling of your face and throat  . A fast heartbeat  . A bad rash all over your body  . Dizziness and weakness    Immunizations Administered    Name Date Dose VIS Date Route   Pfizer COVID-19 Vaccine 08/29/2019 11:22 AM 0.3 mL 06/13/2019 Intramuscular   Manufacturer: [place]   Lot: [id number]   [place]: [id number]

## 2019-09-01 ENCOUNTER — Encounter: Payer: Self-pay | Admitting: Nurse Practitioner

## 2019-09-01 ENCOUNTER — Other Ambulatory Visit: Payer: Self-pay

## 2019-09-01 ENCOUNTER — Ambulatory Visit (INDEPENDENT_AMBULATORY_CARE_PROVIDER_SITE_OTHER): Payer: PPO | Admitting: Nurse Practitioner

## 2019-09-01 VITALS — BP 136/74 | HR 63 | Temp 97.9°F | Ht 62.99 in | Wt 154.6 lb

## 2019-09-01 DIAGNOSIS — F5101 Primary insomnia: Secondary | ICD-10-CM | POA: Diagnosis not present

## 2019-09-01 DIAGNOSIS — R7309 Other abnormal glucose: Secondary | ICD-10-CM

## 2019-09-01 DIAGNOSIS — E7800 Pure hypercholesterolemia, unspecified: Secondary | ICD-10-CM

## 2019-09-01 DIAGNOSIS — I83893 Varicose veins of bilateral lower extremities with other complications: Secondary | ICD-10-CM | POA: Diagnosis not present

## 2019-09-01 DIAGNOSIS — G4700 Insomnia, unspecified: Secondary | ICD-10-CM | POA: Insufficient documentation

## 2019-09-01 DIAGNOSIS — R011 Cardiac murmur, unspecified: Secondary | ICD-10-CM | POA: Diagnosis not present

## 2019-09-01 MED ORDER — SIMVASTATIN 20 MG PO TABS: 20.0000 mg | ORAL_TABLET | Freq: Every day | ORAL | 3 refills | Status: DC

## 2019-09-01 MED ORDER — TRAZODONE HCL 50 MG PO TABS: 25.0000 mg | ORAL_TABLET | Freq: Every evening | ORAL | 3 refills | Status: DC | PRN

## 2019-09-01 NOTE — Assessment & Plan Note (Addendum)
Reports having since childhood, no echo on record.  Asymptomatic.  Will obtain echo to assess function.

## 2019-09-01 NOTE — Progress Notes (Signed)
BP 136/74 (BP Location: Left Arm, Patient Position: Sitting, Cuff Size: Normal)   Pulse 63   Temp 97.9 F (36.6 C) (Oral)   Ht 5' 2.99" (1.6 m)   Wt 154 lb 9.6 oz (70.1 kg)   SpO2 98%   BMI 27.39 kg/m    Subjective:    Patient ID: Brianna Stevens, female    DOB: [birth year], 79 y.o.   MRN: [id number]  HPI: Brianna Stevens is a 79 y.o. female  Chief Complaint  Patient presents with  . Follow-up  . Leg Swelling   HYPERLIPIDEMIA Continues on Simvastatin.  Heart murmur noted on exam today, she reports she has had this since childhood, but has never had echo.  Does endorse occasional flutter of heart at night, but not often. Had vein surgery, varicose veins, about two years ago.  Legs since then will swell sometimes.  This will go away if she wears support hose or rests, has intermittent burning with this, but no significant pain.  Was told she may get this from time to time.  Denies any SOB, CP, orthopnea, erythema, or warmth to extremities. Hyperlipidemia status: good compliance Satisfied with current treatment?  yes Side effects:  no Medication compliance: good compliance Past cholesterol meds: Simvastatin Supplements: fish oil Aspirin:  yes The ASCVD Risk score Mikey Bussing DC Jr., et al., 2013) failed to calculate for the following reasons:   The 2013 ASCVD risk score is only valid for ages 53 to 62 Chest pain:  no Coronary artery disease:  no Family history CAD:  Brother's who had MI Family history early CAD:  no   GERD Continues on Prilosec.  Been on long time.  Takes first thing in morning and then takes extra one during day if needs it. GERD control status: controlled  Satisfied with current treatment? yes Heartburn frequency: none Medication side effects: no  Medication compliance: stable Dysphagia: no Odynophagia:  no Hematemesis: no Blood in stool: no EGD: yes  INSOMNIA Has taken Tylenol PM and Melatonin.  Takes Melatonin every night, 12 MG, then takes Tylenol PM  with it.  Helps her get to sleep, but then wakes up during night.  States she slept one hour last night.  Does sleep in, in the morning, best sleep is early in the morning.  Does not nap during daytime. Duration: chronic Satisfied with sleep quality: no Difficulty falling asleep: no Difficulty staying asleep: no Waking a few hours after sleep onset: no Early morning awakenings: no Daytime hypersomnolence: no Wakes feeling refreshed: yes Good sleep hygiene: yes Apnea: no Snoring: no Depressed/anxious mood: no Recent stress: no Restless legs/nocturnal leg cramps: no Chronic pain/arthritis: no History of sleep study: no Treatments attempted: melatonin and benadryl   Relevant past medical, surgical, family and social history reviewed and updated as indicated. Interim medical history since our last visit reviewed. Allergies and medications reviewed and updated.  Review of Systems  Constitutional: Negative for activity change, appetite change, diaphoresis, fatigue and fever.  Respiratory: Negative for cough, chest tightness, shortness of breath and wheezing.   Cardiovascular: Positive for leg swelling. Negative for chest pain and palpitations.  Gastrointestinal: Negative.   Neurological: Negative.   Psychiatric/Behavioral: Positive for sleep disturbance. Negative for decreased concentration, self-injury and suicidal ideas. The patient is not nervous/anxious.     Per HPI unless specifically indicated above     Objective:    BP 136/74 (BP Location: Left Arm, Patient Position: Sitting, Cuff Size: Normal)   Pulse 63  Temp 97.9 F (36.6 C) (Oral)   Ht 5' 2.99" (1.6 m)   Wt 154 lb 9.6 oz (70.1 kg)   SpO2 98%   BMI 27.39 kg/m   Wt Readings from Last 3 Encounters:  09/01/19 154 lb 9.6 oz (70.1 kg)  12/25/18 145 lb (65.8 kg)  12/18/17 144 lb (65.3 kg)    Physical Exam Vitals and nursing note reviewed.  Constitutional:      General: She is awake. She is not in acute distress.     Appearance: She is well-developed. She is not ill-appearing.  HENT:     Head: Normocephalic.     Right Ear: Hearing normal.     Left Ear: Hearing normal.  Eyes:     General: Lids are normal.        Right eye: No discharge.        Left eye: No discharge.     Conjunctiva/sclera: Conjunctivae normal.     Pupils: Pupils are equal, round, and reactive to light.  Neck:     Thyroid: No thyromegaly.     Vascular: No carotid bruit.  Cardiovascular:     Rate and Rhythm: Normal rate and regular rhythm.     Pulses:          Dorsalis pedis pulses are 2+ on the right side and 2+ on the left side.       Posterior tibial pulses are 2+ on the right side and 2+ on the left side.     Heart sounds: Murmur (Holosystolic) present. Systolic murmur present with a grade of 2/6. No gallop.   Pulmonary:     Effort: Pulmonary effort is normal.     Breath sounds: Normal breath sounds.  Abdominal:     General: Bowel sounds are normal.     Palpations: Abdomen is soft. There is no hepatomegaly or splenomegaly.  Musculoskeletal:     Cervical back: Normal range of motion and neck supple.     Right lower leg: No edema.     Left lower leg: No edema.  Lymphadenopathy:     Cervical: No cervical adenopathy.  Skin:    General: Skin is warm and dry.     Comments: Bilateral lower extremity with varicose veins noted to ankle area.  No edema.  Neurological:     Mental Status: She is alert and oriented to person, place, and time.  Psychiatric:        Attention and Perception: Attention normal.        Mood and Affect: Mood normal.        Behavior: Behavior normal. Behavior is cooperative.        Thought Content: Thought content normal.        Judgment: Judgment normal.     Results for orders placed or performed in visit on 12/31/18  Microscopic Examination   URINE  Result Value Ref Range   WBC, UA 0-5 0 - 5 /hpf   RBC 0-2 0 - 2 /hpf   Epithelial Cells (non renal) 0-10 0 - 10 /hpf   Bacteria, UA Few (A) None  seen/Few  Urinalysis, Routine w reflex microscopic  Result Value Ref Range   Specific Gravity, UA 1.010 1.005 - 1.030   pH, UA 5.5 5.0 - 7.5   Color, UA Yellow Yellow   Appearance Ur Clear Clear   Leukocytes,UA Trace (A) Negative   Protein,UA Negative Negative/Trace   Glucose, UA Negative Negative   Ketones, UA Negative Negative   RBC, UA  1+ (A) Negative   Bilirubin, UA Negative Negative   Urobilinogen, Ur 0.2 0.2 - 1.0 mg/dL   Nitrite, UA Negative Negative   Microscopic Examination See below:   TSH  Result Value Ref Range   TSH 4.400 0.450 - 4.500 uIU/mL  CBC with Differential/Platelet  Result Value Ref Range   WBC 7.4 3.4 - 10.8 x10E3/uL   RBC 4.84 3.77 - 5.28 x10E6/uL   Hemoglobin 14.6 11.1 - 15.9 g/dL   Hematocrit 43.9 34.0 - 46.6 %   MCV 91 79 - 97 fL   MCH 30.2 26.6 - 33.0 pg   MCHC 33.3 31.5 - 35.7 g/dL   RDW 12.4 11.7 - 15.4 %   Platelets 224 150 - 450 x10E3/uL   Neutrophils 27 Not Estab. %   Lymphs 61 Not Estab. %   Monocytes 8 Not Estab. %   Eos 3 Not Estab. %   Basos 1 Not Estab. %   Neutrophils Absolute 2.0 1.4 - 7.0 x10E3/uL   Lymphocytes Absolute 4.5 (H) 0.7 - 3.1 x10E3/uL   Monocytes Absolute 0.6 0.1 - 0.9 x10E3/uL   EOS (ABSOLUTE) 0.2 0.0 - 0.4 x10E3/uL   Basophils Absolute 0.0 0.0 - 0.2 x10E3/uL   Immature Granulocytes 0 Not Estab. %   Immature Grans (Abs) 0.0 0.0 - 0.1 x10E3/uL  Lipid panel  Result Value Ref Range   Cholesterol, Total 187 100 - 199 mg/dL   Triglycerides 90 0 - 149 mg/dL   HDL 59 >39 mg/dL   VLDL Cholesterol Cal 18 5 - 40 mg/dL   LDL Calculated 110 (H) 0 - 99 mg/dL   Chol/HDL Ratio 3.2 0.0 - 4.4 ratio  Comprehensive metabolic panel  Result Value Ref Range   Glucose 94 65 - 99 mg/dL   BUN 18 8 - 27 mg/dL   Creatinine, Ser 0.89 0.57 - 1.00 mg/dL   GFR calc non Af Amer 62 >59 mL/min/1.73   GFR calc Af Amer 71 >59 mL/min/1.73   BUN/Creatinine Ratio 20 12 - 28   Sodium 142 134 - 144 mmol/L   Potassium 5.1 3.5 - 5.2 mmol/L    Chloride 103 96 - 106 mmol/L   CO2 27 20 - 29 mmol/L   Calcium 9.8 8.7 - 10.3 mg/dL   Total Protein 7.3 6.0 - 8.5 g/dL   Albumin 4.5 3.7 - 4.7 g/dL   Globulin, Total 2.8 1.5 - 4.5 g/dL   Albumin/Globulin Ratio 1.6 1.2 - 2.2   Bilirubin Total 0.5 0.0 - 1.2 mg/dL   Alkaline Phosphatase 62 39 - 117 IU/L   AST 21 0 - 40 IU/L   ALT 14 0 - 32 IU/L      Assessment & Plan:   Problem List Items Addressed This Visit      Cardiovascular and Mediastinum   Varicose veins of leg with edema, bilateral    Intermittent edema, none present today.  Recommend consistent use of compression hose daily and may take Tylenol as needed for discomfort.  Return to office for worsening or ongoing, would refer back to vascular if ongoing issues.      Relevant Medications   simvastatin (ZOCOR) 20 MG tablet     Other   Hypercholesteremia - Primary    Chronic, ongoing.  Continue current medication regimen and adjust as needed.  Lipid panel, fasting, today.      Relevant Medications   simvastatin (ZOCOR) 20 MG tablet   Other Relevant Orders   Lipid Panel w/o Chol/HDL Ratio  Elevated glucose    Check BMP today.      Relevant Orders   Basic Metabolic Panel (BMET)   Heart murmur, systolic    Reports having since childhood, no echo on record.  Asymptomatic.  Will obtain echo to assess function.      Relevant Orders   ECHOCARDIOGRAM COMPLETE   Insomnia    Ongoing issue, has tried Tylenol PM and Melatonin with no success.  Discussed BEERS criteria and recommended against Tylenol PM due to Benadryl component.  Will trial low dose of Trazodone to take as needed.  Script sent.  Discussed healthy sleep hygiene techniques.  Return to office in 6 weeks.          Follow up plan: Return in about 6 weeks (around 10/13/2019) for insomnia.

## 2019-09-01 NOTE — Assessment & Plan Note (Signed)
Ongoing issue, has tried Tylenol PM and Melatonin with no success.  Discussed BEERS criteria and recommended against Tylenol PM due to Benadryl component.  Will trial low dose of Trazodone to take as needed.  Script sent.  Discussed healthy sleep hygiene techniques.  Return to office in 6 weeks.

## 2019-09-01 NOTE — Assessment & Plan Note (Signed)
Check BMP today 

## 2019-09-01 NOTE — Patient Instructions (Signed)

## 2019-09-01 NOTE — Assessment & Plan Note (Signed)
Chronic, ongoing.  Continue current medication regimen and adjust as needed.  Lipid panel, fasting, today.

## 2019-09-01 NOTE — Assessment & Plan Note (Signed)
Intermittent edema, none present today.  Recommend consistent use of compression hose daily and may take Tylenol as needed for discomfort.  Return to office for worsening or ongoing, would refer back to vascular if ongoing issues.

## 2019-09-02 ENCOUNTER — Telehealth: Payer: Self-pay | Admitting: Nurse Practitioner

## 2019-09-02 LAB — BASIC METABOLIC PANEL
BUN/Creatinine Ratio: 18 (ref 12–28)
BUN: 17 mg/dL (ref 8–27)
CO2: 24 mmol/L (ref 20–29)
Calcium: 9.5 mg/dL (ref 8.7–10.3)
Chloride: 102 mmol/L (ref 96–106)
Creatinine, Ser: 0.93 mg/dL (ref 0.57–1.00)
GFR calc Af Amer: 67 mL/min/{1.73_m2} (ref >59)
GFR calc non Af Amer: 58 mL/min/{1.73_m2} — ABNORMAL LOW (ref >59)
Glucose: 91 mg/dL (ref 65–99)
Potassium: 5.4 mmol/L — ABNORMAL HIGH (ref 3.5–5.2)
Sodium: 141 mmol/L (ref 134–144)

## 2019-09-02 LAB — LIPID PANEL W/O CHOL/HDL RATIO
Cholesterol, Total: 228 mg/dL — ABNORMAL HIGH (ref 100–199)
HDL: 51 mg/dL (ref >39)
LDL Chol Calc (NIH): 149 mg/dL — ABNORMAL HIGH (ref 0–99)
Triglycerides: 153 mg/dL — ABNORMAL HIGH (ref 0–149)
VLDL Cholesterol Cal: 28 mg/dL (ref 5–40)

## 2019-09-02 NOTE — Telephone Encounter (Signed)
Spoke to patient on phone and reviewed labs.  Cholesterol levels slightly elevated, was fasting, but had not taken cholesterol medication in over 3 weeks due to being out of medication.  Will recheck at next visit.  Glucose normal.  Potassium mildly elevated, no current ACE or ARB on board.  Does take daily MVP.  Recommend changing to every other day MVP and reducing foods/drinks high in potassium.  She was able to verbalize this back to provider.

## 2019-09-05 ENCOUNTER — Other Ambulatory Visit
Admission: RE | Admit: 2019-09-05 | Discharge: 2019-09-05 | Disposition: A | Discharge status: Home / Self Care | Payer: PPO | Source: Ambulatory Visit | Attending: Internal Medicine | Admitting: Internal Medicine

## 2019-09-05 DIAGNOSIS — Z01812 Encounter for preprocedural laboratory examination: Secondary | ICD-10-CM | POA: Diagnosis not present

## 2019-09-05 DIAGNOSIS — Z20822 Contact with and (suspected) exposure to covid-19: Secondary | ICD-10-CM | POA: Insufficient documentation

## 2019-09-05 LAB — SARS CORONAVIRUS 2 (TAT 6-24 HRS): SARS Coronavirus 2: NEGATIVE

## 2019-09-08 ENCOUNTER — Other Ambulatory Visit: Payer: PPO

## 2019-09-09 ENCOUNTER — Encounter: Payer: Self-pay | Admitting: Internal Medicine

## 2019-09-10 ENCOUNTER — Encounter
Admission: RE | Disposition: A | Discharge status: Home / Self Care | Payer: Self-pay | Source: Home / Self Care | Attending: Internal Medicine

## 2019-09-10 ENCOUNTER — Other Ambulatory Visit: Payer: Self-pay

## 2019-09-10 ENCOUNTER — Ambulatory Visit: Payer: PPO | Admitting: Certified Registered Nurse Anesthetist

## 2019-09-10 ENCOUNTER — Ambulatory Visit
Admission: RE | Admit: 2019-09-10 | Discharge: 2019-09-10 | Disposition: A | Discharge status: Home / Self Care | Payer: PPO | Attending: Internal Medicine | Admitting: Internal Medicine

## 2019-09-10 DIAGNOSIS — Z7982 Long term (current) use of aspirin: Secondary | ICD-10-CM | POA: Insufficient documentation

## 2019-09-10 DIAGNOSIS — K5790 Diverticulosis of intestine, part unspecified, without perforation or abscess without bleeding: Secondary | ICD-10-CM | POA: Diagnosis not present

## 2019-09-10 DIAGNOSIS — K219 Gastro-esophageal reflux disease without esophagitis: Secondary | ICD-10-CM | POA: Insufficient documentation

## 2019-09-10 DIAGNOSIS — Z86010 Personal history of colonic polyps: Secondary | ICD-10-CM | POA: Insufficient documentation

## 2019-09-10 DIAGNOSIS — Z79899 Other long term (current) drug therapy: Secondary | ICD-10-CM | POA: Diagnosis not present

## 2019-09-10 DIAGNOSIS — Z1211 Encounter for screening for malignant neoplasm of colon: Secondary | ICD-10-CM | POA: Insufficient documentation

## 2019-09-10 DIAGNOSIS — K635 Polyp of colon: Secondary | ICD-10-CM | POA: Diagnosis not present

## 2019-09-10 DIAGNOSIS — E785 Hyperlipidemia, unspecified: Secondary | ICD-10-CM | POA: Diagnosis not present

## 2019-09-10 DIAGNOSIS — D122 Benign neoplasm of ascending colon: Secondary | ICD-10-CM | POA: Insufficient documentation

## 2019-09-10 DIAGNOSIS — D120 Benign neoplasm of cecum: Secondary | ICD-10-CM | POA: Insufficient documentation

## 2019-09-10 DIAGNOSIS — Z800 Family history of malignant neoplasm of digestive organs: Secondary | ICD-10-CM | POA: Diagnosis not present

## 2019-09-10 DIAGNOSIS — Z7689 Persons encountering health services in other specified circumstances: Secondary | ICD-10-CM | POA: Diagnosis not present

## 2019-09-10 DIAGNOSIS — K5730 Diverticulosis of large intestine without perforation or abscess without bleeding: Secondary | ICD-10-CM | POA: Insufficient documentation

## 2019-09-10 HISTORY — DX: Gastro-esophageal reflux disease without esophagitis: K21.9

## 2019-09-10 HISTORY — DX: Personal history of adenomatous and serrated colon polyps: Z86.0101

## 2019-09-10 HISTORY — DX: Personal history of colonic polyps: Z86.010

## 2019-09-10 HISTORY — PX: COLONOSCOPY WITH PROPOFOL: SHX5780

## 2019-09-10 LAB — HM COLONOSCOPY

## 2019-09-10 SURGERY — COLONOSCOPY WITH PROPOFOL
Anesthesia: General

## 2019-09-10 MED ORDER — LIDOCAINE HCL (CARDIAC) PF 100 MG/5ML IV SOSY
PREFILLED_SYRINGE | INTRAVENOUS | Status: DC | PRN
  Administered 2019-09-10: 60 mg via INTRAVENOUS

## 2019-09-10 MED ORDER — PROPOFOL 500 MG/50ML IV EMUL
INTRAVENOUS | Status: DC | PRN
  Administered 2019-09-10: 40 mg via INTRAVENOUS
  Administered 2019-09-10: 75 ug/kg/min via INTRAVENOUS

## 2019-09-10 MED ORDER — SODIUM CHLORIDE 0.9 % IV SOLN
INTRAVENOUS | Status: DC
  Administered 2019-09-10: 1000 mL via INTRAVENOUS

## 2019-09-10 NOTE — Interval H&P Note (Signed)
History and Physical Interval Note:  09/10/2019 10:48 AM  Brianna Stevens  has presented today for surgery, with the diagnosis of hx adeno polyps fm hx cc.  The various methods of treatment have been discussed with the patient and family. After consideration of risks, benefits and other options for treatment, the patient has consented to  Procedure(s): COLONOSCOPY WITH PROPOFOL (N/A) as a surgical intervention.  The patient's history has been reviewed, patient examined, no change in status, stable for surgery.  I have reviewed the patient's chart and labs.  Questions were answered to the patient's satisfaction.     [place], [place]

## 2019-09-10 NOTE — H&P (Signed)
Outpatient short stay form Pre-procedure 09/10/2019 10:47 AM Brianna Stevens, M.D.  Primary Physician: Park Liter, D.O.  Reason for visit:  Personal hx of adenomatous colon polyps x 2 (May 2015 Colonoscopy).  History of present illness:                            Patient presents for colonoscopy for a personal hx of colon polyps. The patient denies abdominal pain, abnormal weight loss or rectal bleeding.      Current Facility-Administered Medications:  .  0.9 %  sodium chloride infusion, , Intravenous, Continuous, [place], Benay Pike, MD, Last Rate: 20 mL/hr at 09/10/19 1014, 1,000 mL at 09/10/19 1014  Medications Prior to Admission  Medication Sig Dispense Refill Last Dose  . aspirin EC 81 MG tablet Take 81 mg by mouth daily.   Past Week at Unknown time  . calcium-vitamin D 250-100 MG-UNIT per tablet Take 1 tablet by mouth 2 (two) times daily.   Past Week at Unknown time  . meclizine (ANTIVERT) 25 MG tablet Take 1 tablet (25 mg total) by mouth 3 (three) times daily as needed. (Patient taking differently: Take 25 mg by mouth 3 (three) times daily as needed. ) 90 tablet 7 09/09/2019 at Unknown time  . Multiple Vitamin (MULTI-VITAMINS) TABS Take by mouth.   Past Week at Unknown time  . Omega-3 Fatty Acids (FISH OIL) 1000 MG CAPS Take by mouth.   Past Week at Unknown time  . omeprazole (PRILOSEC) 20 MG capsule TAKE ONE CAPSULE BY MOUTH TWICE A DAY 180 capsule 3 Past Week at Unknown time  . simvastatin (ZOCOR) 20 MG tablet Take 1 tablet (20 mg total) by mouth daily. Please call for an appointment for more refills 90 tablet 3 Past Week at Unknown time  . traZODone (DESYREL) 50 MG tablet Take 0.5-1 tablets (25-50 mg total) by mouth at bedtime as needed for sleep. 60 tablet 3 Past Week at Unknown time  . Black Cohosh (BLACK COHOSH HOT FLASH RELIEF) 40 MG CAPS Take by mouth.        Allergies  Allergen Reactions  . Pantoprazole Palpitations     Past Medical History:  Diagnosis Date  .  GERD (gastroesophageal reflux disease)   . Hx of adenomatous colonic polyps   . Hyperlipidemia     Review of systems:  Otherwise negative.    Physical Exam  Gen: Alert, oriented. Appears stated age.  HEENT: [place]/AT. PERRLA. Lungs: CTA, no wheezes. CV: RR nl S1, S2. Abd: soft, benign, no masses. BS+ Ext: No edema. Pulses 2+    Planned procedures: Proceed with colonoscopy. The patient understands the nature of the planned procedure, indications, risks, alternatives and potential complications including but not limited to bleeding, infection, perforation, damage to internal organs and possible oversedation/side effects from anesthesia. The patient agrees and gives consent to proceed.  Please refer to procedure notes for findings, recommendations and patient disposition/instructions.     Brianna Stevens, M.D. Gastroenterology 09/10/2019  10:47 AM

## 2019-09-10 NOTE — Anesthesia Postprocedure Evaluation (Signed)
Anesthesia Post Note  Patient: Brianna Stevens  Procedure(s) Performed: COLONOSCOPY WITH PROPOFOL (N/A )  Patient location during evaluation: Endoscopy Anesthesia Type: General Level of consciousness: awake and alert Pain management: pain level controlled Vital Signs Assessment: post-procedure vital signs reviewed and stable Respiratory status: spontaneous breathing and respiratory function stable Cardiovascular status: stable Anesthetic complications: no     Last Vitals:  Vitals:   09/10/19 1124 09/10/19 1125  BP: 111/70 111/70  Pulse: 67 67  Resp: 19 19  Temp: 36.6 C   SpO2: 97% 97%    Last Pain:  Vitals:   09/10/19 1124  TempSrc: Temporal  PainSc: 0-No pain                 Frederika Hukill K

## 2019-09-10 NOTE — Transfer of Care (Signed)
Immediate Anesthesia Transfer of Care Note  Patient: Brianna Stevens  Procedure(s) Performed: COLONOSCOPY WITH PROPOFOL (N/A )  Patient Location: PACU  Anesthesia Type:General  Level of Consciousness: awake, alert  and oriented  Airway & Oxygen Therapy: Patient Spontanous Breathing  Post-op Assessment: Report given to RN  Post vital signs: Reviewed and stable  Last Vitals:  Vitals Value Taken Time  BP 111/70 09/10/19 1125  Temp 36.6 C 09/10/19 1124  Pulse 69 09/10/19 1125  Resp 15 09/10/19 1125  SpO2 98 % 09/10/19 1125  Vitals shown include unvalidated device data.  Last Pain:  Vitals:   09/10/19 1124  TempSrc: Temporal  PainSc: 0-No pain         Complications: No apparent anesthesia complications

## 2019-09-10 NOTE — Op Note (Signed)
[facility] Gastroenterology Patient Name: Brianna Stevens Procedure Date: 09/10/2019 10:53 AM MRN: [id number] Account #: [id number] Date of Birth: 12-26-1940 Admit Type: Outpatient Age: 79 Room: [facility] ENDO ROOM 3 Gender: Female Note Status: Finalized Procedure:             Colonoscopy Indications:           Surveillance: Personal history of adenomatous polyps                         on last colonoscopy > 5 years ago Providers:             Lorie Apley K. Matelyn Antonelli MD, MD Medicines:             Propofol per Anesthesia Complications:         No immediate complications. Procedure:             Pre-Anesthesia Assessment:                        - The risks and benefits of the procedure and the                         sedation options and risks were discussed with the                         patient. All questions were answered and informed                         consent was obtained.                        - Patient identification and proposed procedure were                         verified prior to the procedure by the nurse. The                         procedure was verified in the procedure room.                        - ASA Grade Assessment: III - A patient with severe                         systemic disease.                        - After reviewing the risks and benefits, the patient                         was deemed in satisfactory condition to undergo the                         procedure.                        After obtaining informed consent, the colonoscope was                         passed under direct vision. Throughout the procedure,  the patient's blood pressure, pulse, and oxygen                         saturations were monitored continuously. The                         Colonoscope was introduced through the anus and                         advanced to the the cecum, identified by appendiceal                         orifice and  ileocecal valve. The colonoscopy was                         performed without difficulty. The patient tolerated                         the procedure well. The quality of the bowel                         preparation was adequate. The ileocecal valve,                         appendiceal orifice, and rectum were photographed. Findings:      The perianal and digital rectal examinations were normal. Pertinent       negatives include normal sphincter tone and no palpable rectal lesions.      Multiple small and large-mouthed diverticula were found in the left       colon.      Two sessile polyps were found in the ascending colon and cecum. The       polyps were 3 to 4 mm in size. These polyps were removed with a jumbo       cold forceps. Resection and retrieval were complete.      The exam was otherwise without abnormality on direct and retroflexion       views. Impression:            - Diverticulosis in the left colon.                        - Two 3 to 4 mm polyps in the ascending colon and in                         the cecum, removed with a jumbo cold forceps. Resected                         and retrieved.                        - The examination was otherwise normal on direct and                         retroflexion views. Recommendation:        - Patient has a contact number available for                         emergencies. The signs and symptoms of potential  delayed complications were discussed with the patient.                         Return to normal activities tomorrow. Written                         discharge instructions were provided to the patient.                        - Resume previous diet.                        - Continue present medications.                        - If polyps are benign or adenomatous without                         dysplasia, I will advise NO further colonoscopy due to                         advanced age and/or severe  comorbidity.                        - Return to GI office PRN.                        - The findings and recommendations were discussed with                         the patient. Procedure Code(s):     --- Professional ---                        [phone], Colonoscopy, flexible; with biopsy, single or                         multiple Diagnosis Code(s):     --- Professional ---                        K57.30, Diverticulosis of large intestine without                         perforation or abscess without bleeding                        K63.5, Polyp of colon                        Z86.010, Personal history of colonic polyps CPT copyright 2019 American Medical Association. All rights reserved. The codes documented in this report are preliminary and upon coder review may  be revised to meet current compliance requirements. Efrain Sella MD, MD 09/10/2019 11:21:34 AM This report has been signed electronically. Number of Addenda: 0 Note Initiated On: 09/10/2019 10:53 AM Scope Withdrawal Time: 0 hours 6 minutes 19 seconds  Total Procedure Duration: 0 hours 16 minutes 12 seconds  Estimated Blood Loss:  Estimated blood loss: none.      [facility]

## 2019-09-10 NOTE — Anesthesia Preprocedure Evaluation (Signed)
Anesthesia Evaluation  Patient identified by MRN, date of birth, ID band Patient awake    Reviewed: Allergy & Precautions, NPO status , Patient's Chart, lab work & pertinent test results  History of Anesthesia Complications Negative for: history of anesthetic complications  Airway Mallampati: II       Dental   Pulmonary neg sleep apnea, neg COPD, Not current smoker,           Cardiovascular (-) hypertension(-) Past MI and (-) CHF (-) dysrhythmias + Valvular Problems/Murmurs (murmur, "since birth", no tx)      Neuro/Psych neg Seizures    GI/Hepatic Neg liver ROS, GERD  Medicated and Controlled,  Endo/Other  neg diabetes  Renal/GU negative Renal ROS     Musculoskeletal   Abdominal   Peds  Hematology   Anesthesia Other Findings   Reproductive/Obstetrics                             Anesthesia Physical Anesthesia Plan  ASA: II  Anesthesia Plan: General   Post-op Pain Management:    Induction: Intravenous  PONV Risk Score and Plan: 3 and Propofol infusion, TIVA and Treatment may vary due to age or medical condition  Airway Management Planned: Nasal Cannula  Additional Equipment:   Intra-op Plan:   Post-operative Plan:   Informed Consent: I have reviewed the patients History and Physical, chart, labs and discussed the procedure including the risks, benefits and alternatives for the proposed anesthesia with the patient or authorized representative who has indicated his/her understanding and acceptance.       Plan Discussed with:   Anesthesia Plan Comments:         Anesthesia Quick Evaluation

## 2019-09-11 ENCOUNTER — Encounter: Payer: Self-pay | Admitting: *Deleted

## 2019-09-11 LAB — SURGICAL PATHOLOGY

## 2019-09-23 ENCOUNTER — Ambulatory Visit: Payer: PPO | Attending: Internal Medicine

## 2019-09-23 DIAGNOSIS — Z23 Encounter for immunization: Secondary | ICD-10-CM

## 2019-09-23 NOTE — Progress Notes (Signed)
   Covid-19 Vaccination Clinic  Name:  Brianna Stevens    MRN: [id number] DOB: 04/16/1941  09/23/2019  Brianna Stevens was observed post Covid-19 immunization for 15 minutes without incident. She was provided with Vaccine Information Sheet and instruction to access the V-Safe system.   Brianna Stevens was instructed to call 911 with any severe reactions post vaccine: Brianna Stevens Kitchen Difficulty breathing  . Swelling of face and throat  . A fast heartbeat  . A bad rash all over body  . Dizziness and weakness   Immunizations Administered    Name Date Dose VIS Date Route   Pfizer COVID-19 Vaccine 09/23/2019  3:50 PM 0.3 mL 06/13/2019 Intramuscular   Manufacturer: [place]   Lot: [id number]   [place]: [id number]

## 2019-09-24 ENCOUNTER — Ambulatory Visit
Admission: RE | Admit: 2019-09-24 | Discharge: 2019-09-24 | Disposition: A | Discharge status: Home / Self Care | Payer: PPO | Source: Ambulatory Visit | Attending: Nurse Practitioner | Admitting: Nurse Practitioner

## 2019-09-24 ENCOUNTER — Other Ambulatory Visit: Payer: Self-pay

## 2019-09-24 DIAGNOSIS — R011 Cardiac murmur, unspecified: Secondary | ICD-10-CM | POA: Insufficient documentation

## 2019-09-24 DIAGNOSIS — E785 Hyperlipidemia, unspecified: Secondary | ICD-10-CM | POA: Insufficient documentation

## 2019-09-24 DIAGNOSIS — K219 Gastro-esophageal reflux disease without esophagitis: Secondary | ICD-10-CM | POA: Insufficient documentation

## 2019-09-24 DIAGNOSIS — I358 Other nonrheumatic aortic valve disorders: Secondary | ICD-10-CM | POA: Insufficient documentation

## 2019-09-24 NOTE — Progress Notes (Signed)
*  PRELIMINARY RESULTS* Echocardiogram 2D Echocardiogram has been performed.  Sherrie Sport 09/24/2019, 11:55 AM

## 2019-09-24 NOTE — Progress Notes (Signed)
Good morning.  I attempted to call patient and left general HIPAA compliant message that I called with results.  Can you please reach out to her in morning and let her know her echo shows good flow, no heart failure.  Valves of heart overall look good, aortic valve has mild thickening, but no tightening, this is good and may be cause of mild murmur we hear on exam.  Overall, no major concerns and we can continue to monitor.  Have a great day!!

## 2019-10-13 ENCOUNTER — Ambulatory Visit: Payer: PPO | Admitting: Nurse Practitioner

## 2019-10-23 ENCOUNTER — Other Ambulatory Visit: Payer: Self-pay | Admitting: Nurse Practitioner

## 2019-10-23 NOTE — Telephone Encounter (Signed)
Requested medication (s) are due for refill today: no  Requested medication (s) are on the active medication list:yes  Last refill:  09/01/2019  Future visit scheduled: No  Notes to clinic:  requesting 90 day with 3 refills   Requested Prescriptions  Pending Prescriptions Disp Refills   traZODone (DESYREL) 50 MG tablet [Pharmacy Med Name: TRAZODONE 50 MG TABLET] 90 tablet 3    Sig: Take 0.5-1 tablets (25-50 mg total) by mouth at bedtime as needed for sleep.      Psychiatry: Antidepressants - Serotonin Modulator Passed - 10/23/2019  9:31 AM      Passed - Valid encounter within last 6 months    Recent Outpatient Visits           1 month ago [place], Henrine Screws T, NP   1 year ago Bilateral hearing loss due to cerumen impaction   [facility], MD   2 years ago [place], Jeannette How, MD   3 years ago [place], Jeannette How, MD   3 years ago Hypercholesteremia   [facility] [place], Jeannette How, MD

## 2019-12-15 ENCOUNTER — Ambulatory Visit (INDEPENDENT_AMBULATORY_CARE_PROVIDER_SITE_OTHER): Payer: PPO

## 2019-12-15 VITALS — Ht 62.9 in | Wt 150.0 lb

## 2019-12-15 DIAGNOSIS — Z0000 Encounter for general adult medical examination without abnormal findings: Secondary | ICD-10-CM

## 2019-12-15 NOTE — Progress Notes (Signed)
Subjective:   Brianna Stevens is a 79 y.o. female who presents for Medicare Annual (Subsequent) preventive examination  I connected with Milta Deiters  today by telephone and verified that I am speaking with the correct person using two identifiers. Location patient: home Location provider: work Persons participating in the virtual visit: patient, Marine scientist.    I discussed the limitations, risks, security and privacy concerns of performing an evaluation and management service by telephone and the availability of in person appointments. I also discussed with the patient that there may be a patient responsible charge related to this service. The patient expressed understanding and verbally consented to this telephonic visit.    Interactive audio and video telecommunications were attempted between this provider and patient, however failed, due to patient having technical difficulties OR patient did not have access to video capability.  We continued and completed visit with audio only.  Some vital signs may be absent or patient reported.   Time Spent with patient on telephone encounter: 20 minutes  Review of Systems:   Cardiac Risk Factors include: advanced age (>68men, >30 women);hypertension;dyslipidemia     Objective:     Vitals: Ht 5' 2.9" (1.598 m)   Wt 150 lb (68 kg)   BMI 26.66 kg/m   Body mass index is 26.66 kg/m.  Advanced Directives 12/15/2019 09/10/2019 12/25/2018 12/17/2017 11/29/2016  Does Patient Have a Medical Advance Directive? No No Yes Yes Yes  Type of Advance Directive - - Living will;Healthcare Power of Attorney Living will;Healthcare Power of [place];Living will  Does patient want to make changes to medical advance directive? Yes (MAU/Ambulatory/Procedural Areas - Information given) - - - -  Copy of Healthcare Power of Attorney in Chart? - - No - copy requested No - copy requested No - copy requested    Tobacco Social History   Tobacco  Use  Smoking Status Never Smoker  Smokeless Tobacco Never Used     Counseling given: Not Answered   Clinical Intake:  Pre-visit preparation completed: Yes  Pain : No/denies pain     Nutritional Status: BMI 25 -29 Overweight Nutritional Risks: None Diabetes: No  How often do you need to have someone help you when you read instructions, pamphlets, or other written materials from your doctor or pharmacy?: 1 - Never  Interpreter Needed?: No  Information entered by :: Char Feltman,LPN  Past Medical History:  Diagnosis Date  . GERD (gastroesophageal reflux disease)   . Hx of adenomatous colonic polyps   . Hyperlipidemia    Past Surgical History:  Procedure Laterality Date  . APPENDECTOMY    . BREAST CYST ASPIRATION Bilateral   . CHOLECYSTECTOMY    . COLONOSCOPY    . COLONOSCOPY WITH PROPOFOL N/A 09/10/2019   Procedure: COLONOSCOPY WITH PROPOFOL;  Surgeon: Toledo, Benay Pike, MD;  Location: [facility] ENDOSCOPY;  Service: Gastroenterology;  Laterality: N/A;  . ESOPHAGOGASTRODUODENOSCOPY    . FLEXIBLE SIGMOIDOSCOPY    . VARICOSE VEIN SURGERY Bilateral    done at [facility]   Family History  Problem Relation Age of Onset  . Prostate cancer Brother 2  . Prostate cancer Brother 40  . Breast cancer Neg Hx    Social History   Socioeconomic History  . Marital status: Widowed    Spouse name: Not on file  . Number of children: Not on file  . Years of education: 48  . Highest education level: Some college, no degree  Occupational History  .  Occupation: retired  Tobacco Use  . Smoking status: Never Smoker  . Smokeless tobacco: Never Used  Vaping Use  . Vaping Use: Never used  Substance and Sexual Activity  . Alcohol use: Yes    Alcohol/week: 1.0 standard drink    Types: 1 Glasses of wine per week    Comment: social  . Drug use: No  . Sexual activity: Not on file  Other Topics Concern  . Not on file  Social History Narrative   Senior citizens  activities    Social Determinants of Health   Financial Resource Strain: Low Risk   . Difficulty of Paying Living Expenses: Not hard at all  Food Insecurity: No Food Insecurity  . Worried About Charity fundraiser in the Last Year: Never true  . Ran Out of Food in the Last Year: Never true  Transportation Needs: No Transportation Needs  . Lack of Transportation (Medical): No  . Lack of Transportation (Non-Medical): No  Physical Activity: Sufficiently Active  . Days of Exercise per Week: 5 days  . Minutes of Exercise per Session: 30 min  Stress:   . Feeling of Stress :   Social Connections: Moderately Isolated  . Frequency of Communication with Friends and Family: More than three times a week  . Frequency of Social Gatherings with Friends and Family: More than three times a week  . Attends Religious Services: Never  . Active Member of Clubs or Organizations: Yes  . Attends Archivist Meetings: More than 4 times per year  . Marital Status: Widowed    Outpatient Encounter Medications as of 12/15/2019  Medication Sig  . aspirin EC 81 MG tablet Take 81 mg by mouth daily.  . calcium-vitamin D 250-100 MG-UNIT per tablet Take 1 tablet by mouth 2 (two) times daily.  . meclizine (ANTIVERT) 25 MG tablet Take 1 tablet (25 mg total) by mouth 3 (three) times daily as needed. (Patient taking differently: Take 25 mg by mouth 3 (three) times daily as needed. )  . Multiple Vitamin (MULTI-VITAMINS) TABS Take by mouth.  Marland Kitchen omeprazole (PRILOSEC) 20 MG capsule TAKE ONE CAPSULE BY MOUTH TWICE A DAY  . simvastatin (ZOCOR) 20 MG tablet Take 1 tablet (20 mg total) by mouth daily. Please call for an appointment for more refills  . traZODone (DESYREL) 50 MG tablet TAKE 0.5-1 TABLETS (25-50 MG TOTAL) BY MOUTH AT BEDTIME AS NEEDED FOR SLEEP.  . [DISCONTINUED] Black Cohosh (BLACK COHOSH HOT FLASH RELIEF) 40 MG CAPS Take by mouth. (Patient not taking: Reported on 12/15/2019)  . [DISCONTINUED] Omega-3  Fatty Acids (FISH OIL) 1000 MG CAPS Take by mouth. (Patient not taking: Reported on 12/15/2019)   No facility-administered encounter medications on file as of 12/15/2019.    Activities of Daily Living In your present state of health, do you have any difficulty performing the following activities: 12/15/2019 09/01/2019  Hearing? N N  Comment no hearing aids -  Vision? N N  Comment reading glasses -  Difficulty concentrating or making decisions? N N  Walking or climbing stairs? N N  Dressing or bathing? N N  Doing errands, shopping? N N  Preparing Food and eating ? N -  Using the Toilet? N -  In the past six months, have you accidently leaked urine? N -  Do you have problems with loss of bowel control? N -  Managing your Medications? N -  Managing your Finances? N -  Housekeeping or managing your Housekeeping? N -  Some recent data might be hidden    Patient Care Team: Valerie Roys, DO as PCP - General (Family Medicine) Schermerhorn, Gwen Her, MD as Referring Physician (Obstetrics and Gynecology)    Assessment:   This is a routine wellness examination for [facility].  Exercise Activities and Dietary recommendations Current Exercise Habits: Home exercise routine, Type of exercise: walking;strength training/weights, Time (Minutes): 35, Frequency (Times/Week): 6, Weekly Exercise (Minutes/Week): 210, Intensity: Mild, Exercise limited by: None identified  Goals Addressed   None     Fall Risk: Fall Risk  12/15/2019 09/01/2019 05/23/2019 01/30/2019 12/25/2018  Falls in the past year? 0 0 0 (No Data) 0  Comment - - Emmi Telephone Survey: data to providers prior to load Emmi Telephone Survey: data to providers prior to load -  Number falls in past yr: 0 0 - (No Data) -  Comment - - - Emmi Telephone Survey Actual Response =  -  Injury with Fall? 0 0 - - -    FALL RISK PREVENTION PERTAINING TO THE HOME:  Any stairs in or around the home? Yes  If so, are there any without handrails? No    Home free of loose throw rugs in walkways, pet beds, electrical cords, etc? Yes  Adequate lighting in your home to reduce risk of falls? Yes   ASSISTIVE DEVICES UTILIZED TO PREVENT FALLS:  Life alert? No  Use of a cane, walker or w/c? No  Grab bars in the bathroom? Yes  Shower chair or bench in shower? Yes  Elevated toilet seat or a handicapped toilet? Yes   DME ORDERS:  DME order needed?  No   TIMED UP AND GO:  Unable to perform    Depression Screen PHQ 2/9 Scores 12/15/2019 09/01/2019 12/25/2018 12/17/2017  PHQ - 2 Score 0 0 0 0  PHQ- 9 Score - 2 - -     Cognitive Function     6CIT Screen 12/25/2018 12/17/2017 11/29/2016  What Year? 0 points 0 points 0 points  What month? 0 points 0 points 0 points  What time? 0 points 0 points 0 points  Count back from 20 0 points 0 points 0 points  Months in reverse 0 points 0 points 2 points  Repeat phrase 0 points 4 points 2 points  Total Score 0 4 4    Immunization History  Administered Date(s) Administered  . Influenza, High Dose Seasonal PF 04/04/2017  . Influenza-Unspecified 04/04/2017, 04/24/2019  . PFIZER SARS-COV-2 Vaccination 08/29/2019, 09/23/2019  . Pneumococcal Conjugate-13 07/03/2009  . Pneumococcal Polysaccharide-23 07/03/2010    Qualifies for Shingles Vaccine? Yes  Zostavax completed 2-3 years ago. Due for Shingrix. Education has been provided regarding the importance of this vaccine. Pt has been advised to call insurance company to determine out of pocket expense. Advised may also receive vaccine at local pharmacy or Health Dept. Verbalized acceptance and understanding.  Tdap: Discussed need for TD/TDAP vaccine, patient verbalized understanding that this is not covered as a preventative with there insurance and to call the office if she develops any new skin injuries, ie: cuts, scrapes, bug bites, or open wounds.  Flu Vaccine: up to date   Pneumococcal Vaccine: up to date .   Covid-19 Vaccine: Completed  vaccines  Screening Tests Health Maintenance  Topic Date Due  . TETANUS/TDAP  Never done  . INFLUENZA VACCINE  02/01/2020  . DEXA SCAN  Completed  . COVID-19 Vaccine  Completed  . PNA vac Low Risk Adult  Completed  Cancer Screenings:  Colorectal Screening: completed 09/10/2019. No longer required. .  Mammogram: Completed 07/17/2019. Would like to continue yearly screening   Bone Density: Completed 2013   Lung Cancer Screening: (Low Dose CT Chest recommended if Age 44-80 years, 30 pack-year currently smoking OR have quit w/in 15years.) does not qualify.     Additional Screening:  Hepatitis C Screening: does not qualify  Vision Screening: Recommended annual ophthalmology exams for early detection of glaucoma and other disorders of the eye. Is the patient up to date with their annual eye exam?  Yes  Who is the provider or what is the name of the office in which the pt attends annual eye exams? Dr.Newbern    Dental Screening: Recommended annual dental exams for proper oral hygiene  Community Resource Referral:  CRR required this visit?  No       Plan:  I have personally reviewed and addressed the Medicare Annual Wellness questionnaire and have noted the following in the patient's chart:  A. Medical and social history B. Use of alcohol, tobacco or illicit drugs  C. Current medications and supplements D. Functional ability and status E.  Nutritional status F.  Physical activity G. Advance directives H. List of other physicians I.  Hospitalizations, surgeries, and ER visits in previous 12 months J.  [place] such as hearing and vision if needed, cognitive and depression L. Referrals and appointments   In addition, I have reviewed and discussed with patient certain preventive protocols, quality metrics, and best practice recommendations. A written personalized care plan for preventive services as well as general  preventive health recommendations were  provided to patient.   Due to this being a telephonic visit, the after visit summary with patients personalized plan was offered to patient via mail or my-chart. Patient declined at this time  Nicholos Johns, [place]  [id number]/[id number] Nurse Health Advisor   Nurse Notes: none

## 2019-12-15 NOTE — Patient Instructions (Signed)
Brianna Stevens , Thank you for taking time to come for your Medicare Wellness Visit. I appreciate your ongoing commitment to your health goals. Please review the following plan we discussed and let me know if I can assist you in the future.   Screening recommendations/referrals: Colonoscopy: completed 09/10/2019, no longer required  Mammogram: completed 07/2019 Bone Density: completed 2013 Recommended yearly ophthalmology/optometry visit for glaucoma screening and checkup Recommended yearly dental visit for hygiene and checkup  Vaccinations: Influenza vaccine: due 03/2020 Pneumococcal vaccine: completed series  Tdap vaccine: due , check with insurance for coverage  Shingles vaccine: shingrix eligible, check with insurance for coverage    Covid-19:completed   Advanced directives: Advance directive discussed with you today. I have provided a copy for you to complete at home via mail and have notarized. Once this is complete please bring a copy in to our office so we can scan it into your chart.  Conditions/risks identified: none   Next appointment: Follow up in one year for your annual wellness visit    Preventive Care 65 Years and Older, Female Preventive care refers to lifestyle choices and visits with your health care provider that can promote health and wellness. What does preventive care include?  A yearly physical exam. This is also called an annual well check.  Dental exams once or twice a year.  Routine eye exams. Ask your health care provider how often you should have your eyes checked.  Personal lifestyle choices, including:  Daily care of your teeth and gums.  Regular physical activity.  Eating a healthy diet.  Avoiding tobacco and drug use.  Limiting alcohol use.  Practicing safe sex.  Taking low-dose aspirin every day.  Taking vitamin and mineral supplements as recommended by your health care provider. What happens during an annual well check? The services  and screenings done by your health care provider during your annual well check will depend on your age, overall health, lifestyle risk factors, and family history of disease. Counseling  Your health care provider may ask you questions about your:  Alcohol use.  Tobacco use.  Drug use.  Emotional well-being.  Home and relationship well-being.  Sexual activity.  Eating habits.  History of falls.  Memory and ability to understand (cognition).  Work and work Statistician.  Reproductive health. Screening  You may have the following tests or measurements:  Height, weight, and BMI.  Blood pressure.  Lipid and cholesterol levels. These may be checked every 5 years, or more frequently if you are over 24 years old.  Skin check.  Lung cancer screening. You may have this screening every year starting at age 40 if you have a 30-pack-year history of smoking and currently smoke or have quit within the past 15 years.  Fecal occult blood test (FOBT) of the stool. You may have this test every year starting at age 48.  Flexible sigmoidoscopy or colonoscopy. You may have a sigmoidoscopy every 5 years or a colonoscopy every 10 years starting at age 5.  Hepatitis C blood test.  Hepatitis B blood test.  Sexually transmitted disease (STD) testing.  Diabetes screening. This is done by checking your blood sugar (glucose) after you have not eaten for a while (fasting). You may have this done every 1-3 years.  Bone density scan. This is done to screen for osteoporosis. You may have this done starting at age 9.  Mammogram. This may be done every 1-2 years. Talk to your health care provider about how often you should  have regular mammograms. Talk with your health care provider about your test results, treatment options, and if necessary, the need for more tests. Vaccines  Your health care provider may recommend certain vaccines, such as:  Influenza vaccine. This is recommended every  year.  Tetanus, diphtheria, and acellular pertussis (Tdap, Td) vaccine. You may need a Td booster every 10 years.  Zoster vaccine. You may need this after age 64.  Pneumococcal 13-valent conjugate (PCV13) vaccine. One dose is recommended after age 7.  Pneumococcal polysaccharide (PPSV23) vaccine. One dose is recommended after age 67. Talk to your health care provider about which screenings and vaccines you need and how often you need them. This information is not intended to replace advice given to you by your health care provider. Make sure you discuss any questions you have with your health care provider. Document Released: 07/16/2015 Document Revised: 03/08/2016 Document Reviewed: 04/20/2015 Elsevier Interactive Patient Education  2017 [place] Prevention in the Home Falls can cause injuries. They can happen to people of all ages. There are many things you can do to make your home safe and to help prevent falls. What can I do on the outside of my home?  Regularly fix the edges of walkways and driveways and fix any cracks.  Remove anything that might make you trip as you walk through a door, such as a raised step or threshold.  Trim any bushes or trees on the path to your home.  Use bright outdoor lighting.  Clear any walking paths of anything that might make someone trip, such as rocks or tools.  Regularly check to see if handrails are loose or broken. Make sure that both sides of any steps have handrails.  Any raised decks and porches should have guardrails on the edges.  Have any leaves, snow, or ice cleared regularly.  Use sand or salt on walking paths during winter.  Clean up any spills in your garage right away. This includes oil or grease spills. What can I do in the bathroom?  Use night lights.  Install grab bars by the toilet and in the tub and shower. Do not use towel bars as grab bars.  Use non-skid mats or decals in the tub or shower.  If you  need to sit down in the shower, use a plastic, non-slip stool.  Keep the floor dry. Clean up any water that spills on the floor as soon as it happens.  Remove soap buildup in the tub or shower regularly.  Attach bath mats securely with double-sided non-slip rug tape.  Do not have throw rugs and other things on the floor that can make you trip. What can I do in the bedroom?  Use night lights.  Make sure that you have a light by your bed that is easy to reach.  Do not use any sheets or blankets that are too big for your bed. They should not hang down onto the floor.  Have a firm chair that has side arms. You can use this for support while you get dressed.  Do not have throw rugs and other things on the floor that can make you trip. What can I do in the kitchen?  Clean up any spills right away.  Avoid walking on wet floors.  Keep items that you use a lot in easy-to-reach places.  If you need to reach something above you, use a strong step stool that has a grab bar.  Keep electrical cords out of  the way.  Do not use floor polish or wax that makes floors slippery. If you must use wax, use non-skid floor wax.  Do not have throw rugs and other things on the floor that can make you trip. What can I do with my stairs?  Do not leave any items on the stairs.  Make sure that there are handrails on both sides of the stairs and use them. Fix handrails that are broken or loose. Make sure that handrails are as long as the stairways.  Check any carpeting to make sure that it is firmly attached to the stairs. Fix any carpet that is loose or worn.  Avoid having throw rugs at the top or bottom of the stairs. If you do have throw rugs, attach them to the floor with carpet tape.  Make sure that you have a light switch at the top of the stairs and the bottom of the stairs. If you do not have them, ask someone to add them for you. What else can I do to help prevent falls?  Wear shoes  that:  Do not have high heels.  Have rubber bottoms.  Are comfortable and fit you well.  Are closed at the toe. Do not wear sandals.  If you use a stepladder:  Make sure that it is fully opened. Do not climb a closed stepladder.  Make sure that both sides of the stepladder are locked into place.  Ask someone to hold it for you, if possible.  Clearly mark and make sure that you can see:  Any grab bars or handrails.  First and last steps.  Where the edge of each step is.  Use tools that help you move around (mobility aids) if they are needed. These include:  Canes.  Walkers.  Scooters.  Crutches.  Turn on the lights when you go into a dark area. Replace any light bulbs as soon as they burn out.  Set up your furniture so you have a clear path. Avoid moving your furniture around.  If any of your floors are uneven, fix them.  If there are any pets around you, be aware of where they are.  Review your medicines with your doctor. Some medicines can make you feel dizzy. This can increase your chance of falling. Ask your doctor what other things that you can do to help prevent falls. This information is not intended to replace advice given to you by your health care provider. Make sure you discuss any questions you have with your health care provider. Document Released: 04/15/2009 Document Revised: 11/25/2015 Document Reviewed: 07/24/2014 Elsevier Interactive Patient Education  2017 [employer].

## 2020-01-06 ENCOUNTER — Other Ambulatory Visit: Payer: Self-pay

## 2020-01-06 MED ORDER — OMEPRAZOLE 20 MG PO CPDR: 20.0000 mg | DELAYED_RELEASE_CAPSULE | Freq: Two times a day (BID) | ORAL | 3 refills | Status: DC

## 2020-01-06 NOTE — Telephone Encounter (Signed)
Last filled 12/29/2018 for 90 days with 3 refills.

## 2020-04-27 ENCOUNTER — Other Ambulatory Visit: Payer: Self-pay | Admitting: Obstetrics and Gynecology

## 2020-04-27 DIAGNOSIS — Z780 Asymptomatic menopausal state: Secondary | ICD-10-CM | POA: Diagnosis not present

## 2020-04-27 DIAGNOSIS — Z13820 Encounter for screening for osteoporosis: Secondary | ICD-10-CM | POA: Diagnosis not present

## 2020-04-27 DIAGNOSIS — Z124 Encounter for screening for malignant neoplasm of cervix: Secondary | ICD-10-CM | POA: Diagnosis not present

## 2020-04-27 DIAGNOSIS — Z1231 Encounter for screening mammogram for malignant neoplasm of breast: Secondary | ICD-10-CM | POA: Diagnosis not present

## 2020-04-27 DIAGNOSIS — Z01411 Encounter for gynecological examination (general) (routine) with abnormal findings: Secondary | ICD-10-CM | POA: Diagnosis not present

## 2020-04-27 DIAGNOSIS — Z1331 Encounter for screening for depression: Secondary | ICD-10-CM | POA: Diagnosis not present

## 2020-04-27 DIAGNOSIS — B354 Tinea corporis: Secondary | ICD-10-CM | POA: Diagnosis not present

## 2020-05-03 DIAGNOSIS — Z780 Asymptomatic menopausal state: Secondary | ICD-10-CM | POA: Diagnosis not present

## 2020-05-03 LAB — HM DEXA SCAN

## 2020-08-04 ENCOUNTER — Ambulatory Visit
Admission: RE | Admit: 2020-08-04 | Discharge: 2020-08-04 | Disposition: A | Discharge status: Home / Self Care | Payer: PPO | Source: Ambulatory Visit | Attending: Obstetrics and Gynecology | Admitting: Obstetrics and Gynecology

## 2020-08-04 ENCOUNTER — Other Ambulatory Visit: Payer: Self-pay

## 2020-08-04 DIAGNOSIS — Z1231 Encounter for screening mammogram for malignant neoplasm of breast: Secondary | ICD-10-CM | POA: Insufficient documentation

## 2020-08-27 ENCOUNTER — Other Ambulatory Visit: Payer: Self-pay | Admitting: Nurse Practitioner

## 2020-08-27 DIAGNOSIS — E7800 Pure hypercholesterolemia, unspecified: Secondary | ICD-10-CM

## 2020-11-24 ENCOUNTER — Other Ambulatory Visit: Payer: Self-pay

## 2020-11-24 ENCOUNTER — Ambulatory Visit (INDEPENDENT_AMBULATORY_CARE_PROVIDER_SITE_OTHER): Payer: PPO | Admitting: Family Medicine

## 2020-11-24 ENCOUNTER — Other Ambulatory Visit: Payer: Self-pay | Admitting: Nurse Practitioner

## 2020-11-24 ENCOUNTER — Encounter: Payer: Self-pay | Admitting: Family Medicine

## 2020-11-24 VITALS — BP 127/80 | HR 60 | Temp 98.0°F | Wt 150.2 lb

## 2020-11-24 DIAGNOSIS — K2960 Other gastritis without bleeding: Secondary | ICD-10-CM

## 2020-11-24 DIAGNOSIS — E7800 Pure hypercholesterolemia, unspecified: Secondary | ICD-10-CM

## 2020-11-24 DIAGNOSIS — F5101 Primary insomnia: Secondary | ICD-10-CM

## 2020-11-24 DIAGNOSIS — H60391 Other infective otitis externa, right ear: Secondary | ICD-10-CM | POA: Diagnosis not present

## 2020-11-24 DIAGNOSIS — R8281 Pyuria: Secondary | ICD-10-CM

## 2020-11-24 LAB — URINALYSIS, ROUTINE W REFLEX MICROSCOPIC
Bilirubin, UA: NEGATIVE
Glucose, UA: NEGATIVE
Ketones, UA: NEGATIVE
Nitrite, UA: NEGATIVE
Protein,UA: NEGATIVE
Specific Gravity, UA: 1.01 (ref 1.005–1.030)
Urobilinogen, Ur: 0.2 mg/dL (ref 0.2–1.0)
pH, UA: 5.5 (ref 5.0–7.5)

## 2020-11-24 LAB — MICROSCOPIC EXAMINATION
Bacteria, UA: NONE SEEN
Epithelial Cells (non renal): NONE SEEN /hpf (ref 0–10)

## 2020-11-24 MED ORDER — MUPIROCIN CALCIUM 2 % EX CREA: 1.0000 "application " | TOPICAL_CREAM | Freq: Two times a day (BID) | CUTANEOUS | 0 refills | Status: DC

## 2020-11-24 MED ORDER — SIMVASTATIN 20 MG PO TABS: 20.0000 mg | ORAL_TABLET | Freq: Every day | ORAL | 0 refills | Status: DC

## 2020-11-24 MED ORDER — OMEPRAZOLE 20 MG PO CPDR: 20.0000 mg | DELAYED_RELEASE_CAPSULE | Freq: Two times a day (BID) | ORAL | 1 refills | Status: DC

## 2020-11-24 MED ORDER — MECLIZINE HCL 25 MG PO TABS: 25.0000 mg | ORAL_TABLET | Freq: Three times a day (TID) | ORAL | 3 refills | Status: DC | PRN

## 2020-11-24 NOTE — Addendum Note (Signed)
Addended by: Valerie Roys on: 11/24/2020 11:41 AM   Modules accepted: Orders

## 2020-11-24 NOTE — Assessment & Plan Note (Signed)
Using tylenol PM PRN. Continue to monitor. Call with any concerns.

## 2020-11-24 NOTE — Telephone Encounter (Signed)
Requested Prescriptions  Pending Prescriptions Disp Refills  . simvastatin (ZOCOR) 20 MG tablet [Pharmacy Med Name: SIMVASTATIN 20 MG TABLET] 90 tablet 0    Sig: TAKE 1 TABLET (20 MG TOTAL) BY MOUTH DAILY. PLEASE CALL FOR AN APPOINTMENT FOR MORE REFILLS     Cardiovascular:  Antilipid - Statins Failed - 11/24/2020  1:37 AM      Failed - Total Cholesterol in normal range and within 360 days    Cholesterol, Total  Date Value Ref Range Status  09/01/2019 228 (H) 100 - 199 mg/dL Final   Cholesterol Piccolo, Waived  Date Value Ref Range Status  12/17/2017 172 <200 mg/dL Final    Comment:                            Desirable                <200                         Borderline High      200- 239                         High                     >239          Failed - LDL in normal range and within 360 days    LDL Chol Calc (NIH)  Date Value Ref Range Status  09/01/2019 149 (H) 0 - 99 mg/dL Final         Failed - HDL in normal range and within 360 days    HDL  Date Value Ref Range Status  09/01/2019 51 >39 mg/dL Final         Failed - Triglycerides in normal range and within 360 days    Triglycerides  Date Value Ref Range Status  09/01/2019 153 (H) 0 - 149 mg/dL Final   Triglycerides Piccolo,Waived  Date Value Ref Range Status  12/17/2017 72 <150 mg/dL Final    Comment:                            Normal                   <150                         Borderline High     150 - 199                         High                200 - 499                         Very High                >499          Passed - Patient is not pregnant      Passed - Valid encounter within last 12 months    Recent Outpatient Visits          1 year ago [place], [place] T, NP   2 years ago Bilateral hearing  loss due to cerumen impaction   [facility] Jeananne Rama, Jeannette How, MD   3 years ago [place]  Jeananne Rama, Jeannette How, MD   4 years ago [place], Jeannette How, MD   4 years ago [place], Jeannette How, MD      Future Appointments            Today Valerie Roys, DO [place], [place]   In 3 weeks  [facility] [place], [place]

## 2020-11-24 NOTE — Assessment & Plan Note (Signed)
Under good control on current regimen. Continue current regimen. Continue to monitor. Call with any concerns. Refills given. Labs drawn today.   

## 2020-11-24 NOTE — Progress Notes (Signed)
BP 127/80   Pulse 60   Temp 98 F (36.7 C)   Wt 150 lb 3.2 oz (68.1 kg)   SpO2 (!) 60%   BMI 26.69 kg/m    Subjective:    Patient ID: Brianna Stevens, female    DOB: Mar 03, 1941, 80 y.o.   MRN: [id number]  HPI: Brianna Stevens is a 80 y.o. female  Chief Complaint  Patient presents with  . ear concern    Patient states she noticed about a week ago she started feeling her right ear lobe itchy and painful. Patient states ear lobe was swollen and had some drainage from the hole where her piercing is.    EAR PAIN Duration: over a week Involved ear(s): right Severity:  mild  Quality:  swollen Fever: no Otorrhea: no Upper respiratory infection symptoms: no Pruritus: no Hearing loss: no Water immersion no Using Q-tips: no Recurrent otitis media: no Status: better Treatments attempted: none  HYPERLIPIDEMIA Hyperlipidemia status: excellent compliance Satisfied with current treatment?  yes Side effects:  no Medication compliance: excellent compliance Past cholesterol meds: simvastatin Supplements: none Aspirin:  no The ASCVD Risk score Mikey Bussing DC Jr., et al., 2013) failed to calculate for the following reasons:   The 2013 ASCVD risk score is only valid for ages 26 to 39 Chest pain:  no Coronary artery disease:  no  INSOMNIA- has not been taking the trazodone, wasn't helping her sleep. Does not feel like she has been sleeping well Duration: chronic Satisfied with sleep quality: no Difficulty falling asleep: yes Difficulty staying asleep: yes Waking a few hours after sleep onset: yes Early morning awakenings: no Daytime hypersomnolence: no Wakes feeling refreshed: no Good sleep hygiene: yes Apnea: no Snoring: no Depressed/anxious mood: no Recent stress: no Restless legs/nocturnal leg cramps: no Chronic pain/arthritis: no History of sleep study: no Treatments attempted: melatonin, uinsom and benadryl   Relevant past medical, surgical, family and social history  reviewed and updated as indicated. Interim medical history since our last visit reviewed. Allergies and medications reviewed and updated.  Review of Systems  Constitutional: Negative.   HENT: Negative.   Respiratory: Negative.   Cardiovascular: Negative.   Gastrointestinal: Negative.   Skin: Positive for color change. Negative for pallor, rash and wound.  Neurological: Negative.   Psychiatric/Behavioral: Negative.     Per HPI unless specifically indicated above     Objective:    BP 127/80   Pulse 60   Temp 98 F (36.7 C)   Wt 150 lb 3.2 oz (68.1 kg)   SpO2 (!) 60%   BMI 26.69 kg/m   Wt Readings from Last 3 Encounters:  11/24/20 150 lb 3.2 oz (68.1 kg)  12/15/19 150 lb (68 kg)  09/10/19 150 lb (68 kg)    Physical Exam Vitals and nursing note reviewed.  Constitutional:      General: She is not in acute distress.    Appearance: Normal appearance. She is not ill-appearing, toxic-appearing or diaphoretic.  HENT:     Head: Normocephalic and atraumatic.     Comments: Red, irritated piercing on R earlobe    Right Ear: External ear normal.     Left Ear: External ear normal.     Nose: Nose normal.     Mouth/Throat:     Mouth: Mucous membranes are moist.     Pharynx: Oropharynx is clear.  Eyes:     General: No scleral icterus.       Right eye: No discharge.  Left eye: No discharge.     Extraocular Movements: Extraocular movements intact.     Conjunctiva/sclera: Conjunctivae normal.     Pupils: Pupils are equal, round, and reactive to light.  Cardiovascular:     Rate and Rhythm: Normal rate and regular rhythm.     Pulses: Normal pulses.     Heart sounds: Normal heart sounds. No murmur heard. No friction rub. No gallop.   Pulmonary:     Effort: Pulmonary effort is normal. No respiratory distress.     Breath sounds: Normal breath sounds. No stridor. No wheezing, rhonchi or rales.  Chest:     Chest wall: No tenderness.  Musculoskeletal:        General: Normal  range of motion.     Cervical back: Normal range of motion and neck supple.  Skin:    General: Skin is warm and dry.     Capillary Refill: Capillary refill takes less than 2 seconds.     Coloration: Skin is not jaundiced or pale.     Findings: No bruising, erythema, lesion or rash.  Neurological:     General: No focal deficit present.     Mental Status: She is alert and oriented to person, place, and time. Mental status is at baseline.  Psychiatric:        Mood and Affect: Mood normal.        Behavior: Behavior normal.        Thought Content: Thought content normal.        Judgment: Judgment normal.     Results for orders placed or performed in visit on 09/22/19  HM COLONOSCOPY  Result Value Ref Range   HM Colonoscopy Patient Reported See Report (in chart), Patient Reported      Assessment & Plan:   Problem List Items Addressed This Visit      Digestive   Reflux gastritis - Primary    Under good control on current regimen. Continue current regimen. Continue to monitor. Call with any concerns. Refills given. Labs drawn today.        Relevant Orders   CBC with Differential/Platelet   Urinalysis, Routine w reflex microscopic     Other   Hypercholesteremia    Under good control on current regimen. Continue current regimen. Continue to monitor. Call with any concerns. Refills given. Labs drawn today.       Relevant Medications   simvastatin (ZOCOR) 20 MG tablet   Other Relevant Orders   Comprehensive metabolic panel   Lipid Panel w/o Chol/HDL Ratio   Urinalysis, Routine w reflex microscopic   Insomnia    Using tylenol PM PRN. Continue to monitor. Call with any concerns.       Relevant Orders   Urinalysis, Routine w reflex microscopic   TSH    Other Visit Diagnoses    Skin of right earlobe with infection       Mild. Will treat with bactroban. Call with any concerns.    Relevant Medications   mupirocin cream (BACTROBAN) 2 %       Follow up plan: Return ASAP  physical with Dr. Neomia Dear.

## 2020-11-25 LAB — CBC WITH DIFFERENTIAL/PLATELET
Basophils Absolute: 0.1 10*3/uL (ref 0.0–0.2)
Basos: 1 %
EOS (ABSOLUTE): 0.2 10*3/uL (ref 0.0–0.4)
Eos: 3 %
Hematocrit: 45 % (ref 34.0–46.6)
Hemoglobin: 15.2 g/dL (ref 11.1–15.9)
Immature Grans (Abs): 0 10*3/uL (ref 0.0–0.1)
Immature Granulocytes: 0 %
Lymphocytes Absolute: 4.8 10*3/uL — ABNORMAL HIGH (ref 0.7–3.1)
Lymphs: 58 %
MCH: 30.8 pg (ref 26.6–33.0)
MCHC: 33.8 g/dL (ref 31.5–35.7)
MCV: 91 fL (ref 79–97)
Monocytes Absolute: 0.7 10*3/uL (ref 0.1–0.9)
Monocytes: 8 %
Neutrophils Absolute: 2.5 10*3/uL (ref 1.4–7.0)
Neutrophils: 30 %
Platelets: 233 10*3/uL (ref 150–450)
RBC: 4.94 x10E6/uL (ref 3.77–5.28)
RDW: 12 % (ref 11.7–15.4)
WBC: 8.3 10*3/uL (ref 3.4–10.8)

## 2020-11-25 LAB — COMPREHENSIVE METABOLIC PANEL
ALT: 17 IU/L (ref 0–32)
AST: 18 IU/L (ref 0–40)
Albumin/Globulin Ratio: 1.3 (ref 1.2–2.2)
Albumin: 4.4 g/dL (ref 3.6–4.6)
Alkaline Phosphatase: 75 IU/L (ref 44–121)
BUN/Creatinine Ratio: 18 (ref 12–28)
BUN: 19 mg/dL (ref 8–27)
Bilirubin Total: 0.3 mg/dL (ref 0.0–1.2)
CO2: 25 mmol/L (ref 20–29)
Calcium: 9.8 mg/dL (ref 8.7–10.3)
Chloride: 101 mmol/L (ref 96–106)
Creatinine, Ser: 1.08 mg/dL — ABNORMAL HIGH (ref 0.57–1.00)
Globulin, Total: 3.4 g/dL (ref 1.5–4.5)
Glucose: 98 mg/dL (ref 65–99)
Potassium: 4.4 mmol/L (ref 3.5–5.2)
Sodium: 139 mmol/L (ref 134–144)
Total Protein: 7.8 g/dL (ref 6.0–8.5)
eGFR: 52 mL/min/{1.73_m2} — ABNORMAL LOW (ref >59)

## 2020-11-25 LAB — LIPID PANEL W/O CHOL/HDL RATIO
Cholesterol, Total: 181 mg/dL (ref 100–199)
HDL: 60 mg/dL (ref >39)
LDL Chol Calc (NIH): 105 mg/dL — ABNORMAL HIGH (ref 0–99)
Triglycerides: 85 mg/dL (ref 0–149)
VLDL Cholesterol Cal: 16 mg/dL (ref 5–40)

## 2020-11-25 LAB — TSH: TSH: 4.55 u[IU]/mL — ABNORMAL HIGH (ref 0.450–4.500)

## 2020-11-26 ENCOUNTER — Encounter: Payer: Self-pay | Admitting: Family Medicine

## 2020-11-27 LAB — URINE CULTURE

## 2020-12-03 ENCOUNTER — Encounter: Payer: PPO | Admitting: Internal Medicine

## 2020-12-14 ENCOUNTER — Encounter: Payer: Self-pay | Admitting: Internal Medicine

## 2020-12-14 ENCOUNTER — Other Ambulatory Visit: Payer: Self-pay

## 2020-12-14 ENCOUNTER — Ambulatory Visit (INDEPENDENT_AMBULATORY_CARE_PROVIDER_SITE_OTHER): Payer: PPO | Admitting: Internal Medicine

## 2020-12-14 VITALS — BP 134/85 | HR 61 | Temp 98.4°F | Ht 62.21 in | Wt 150.5 lb

## 2020-12-14 DIAGNOSIS — Z13820 Encounter for screening for osteoporosis: Secondary | ICD-10-CM

## 2020-12-14 DIAGNOSIS — R011 Cardiac murmur, unspecified: Secondary | ICD-10-CM

## 2020-12-14 DIAGNOSIS — I83893 Varicose veins of bilateral lower extremities with other complications: Secondary | ICD-10-CM | POA: Diagnosis not present

## 2020-12-14 DIAGNOSIS — E7800 Pure hypercholesterolemia, unspecified: Secondary | ICD-10-CM | POA: Diagnosis not present

## 2020-12-14 MED ORDER — HYDROCHLOROTHIAZIDE 12.5 MG PO TABS: 12.5000 mg | ORAL_TABLET | Freq: Every day | ORAL | 1 refills | Status: DC

## 2020-12-14 NOTE — Progress Notes (Signed)
BP 134/85   Pulse 61   Temp 98.4 F (36.9 C) (Oral)   Ht 5' 2.21" (1.58 m)   Wt 150 lb 8 oz (68.3 kg)   SpO2 99%   BMI 27.35 kg/m    Subjective:    Patient ID: Brianna Stevens, female    DOB: 01-20-1941, 80 y.o.   MRN: [id number]  Chief Complaint  Patient presents with  . Annual Exam    HPI: Brianna Stevens is a 80 y.o. female  Pt is here for a physical  Gastroesophageal Reflux She complains of heartburn. She reports no abdominal pain, no early satiety, no globus sensation, no sore throat or no stridor. takes prilosec has been on this for years..  Hyperlipidemia This is a chronic problem. The current episode started more than 1 year ago.  Heart Problem This is a recurrent (soboe, has pedal edema worse at the end of the day) problem. Pertinent negatives include no abdominal pain or sore throat.   Chief Complaint  Patient presents with  . Annual Exam    Relevant past medical, surgical, family and social history reviewed and updated as indicated. Interim medical history since our last visit reviewed. Allergies and medications reviewed and updated.  Review of Systems  HENT:  Negative for sore throat.   Gastrointestinal:  Positive for heartburn. Negative for abdominal pain.   Per HPI unless specifically indicated above     Objective:    BP 134/85   Pulse 61   Temp 98.4 F (36.9 C) (Oral)   Ht 5' 2.21" (1.58 m)   Wt 150 lb 8 oz (68.3 kg)   SpO2 99%   BMI 27.35 kg/m   Wt Readings from Last 3 Encounters:  12/14/20 150 lb 8 oz (68.3 kg)  11/24/20 150 lb 3.2 oz (68.1 kg)  12/15/19 150 lb (68 kg)    Physical Exam Vitals and nursing note reviewed.  Constitutional:      General: She is not in acute distress.    Appearance: Normal appearance. She is not ill-appearing or diaphoretic.  HENT:     Head: Normocephalic and atraumatic.     Right Ear: Tympanic membrane and external ear normal. There is no impacted cerumen.     Left Ear: External ear normal.      Nose: No congestion or rhinorrhea.     Mouth/Throat:     Pharynx: No oropharyngeal exudate or posterior oropharyngeal erythema.  Eyes:     Conjunctiva/sclera: Conjunctivae normal.     Pupils: Pupils are equal, round, and reactive to light.  Cardiovascular:     Rate and Rhythm: Normal rate and regular rhythm.     Heart sounds: No murmur heard.   No friction rub. No gallop.  Pulmonary:     Effort: No respiratory distress.     Breath sounds: No stridor. No wheezing or rhonchi.  Chest:     Chest wall: No tenderness.  Abdominal:     General: Abdomen is flat. Bowel sounds are normal. There is no distension.     Palpations: Abdomen is soft. There is no mass.     Tenderness: There is no abdominal tenderness. There is no guarding.  Musculoskeletal:        General: No swelling or deformity.     Cervical back: Normal range of motion and neck supple. No rigidity or tenderness.     Right lower leg: No edema.     Left lower leg: No edema.  Skin:  General: Skin is warm and dry.     Coloration: Skin is not jaundiced.     Findings: No erythema.  Neurological:     Mental Status: She is alert and oriented to person, place, and time. Mental status is at baseline.  Psychiatric:        Mood and Affect: Mood normal.        Behavior: Behavior normal.        Thought Content: Thought content normal.        Judgment: Judgment normal.    Results for orders placed or performed in visit on 11/24/20  Urine Culture   Specimen: Urine   UR  Result Value Ref Range   Urine Culture, Routine Final report    Organism ID, Bacteria Comment   Microscopic Examination   Urine  Result Value Ref Range   WBC, UA 0-5 0 - 5 /hpf   RBC 0-2 0 - 2 /hpf   Epithelial Cells (non renal) None seen 0 - 10 /hpf   Bacteria, UA None seen None seen/Few  CBC with Differential/Platelet  Result Value Ref Range   WBC 8.3 3.4 - 10.8 x10E3/uL   RBC 4.94 3.77 - 5.28 x10E6/uL   Hemoglobin 15.2 11.1 - 15.9 g/dL   Hematocrit 45.0  34.0 - 46.6 %   MCV 91 79 - 97 fL   MCH 30.8 26.6 - 33.0 pg   MCHC 33.8 31.5 - 35.7 g/dL   RDW 12.0 11.7 - 15.4 %   Platelets 233 150 - 450 x10E3/uL   Neutrophils 30 Not Estab. %   Lymphs 58 Not Estab. %   Monocytes 8 Not Estab. %   Eos 3 Not Estab. %   Basos 1 Not Estab. %   Neutrophils Absolute 2.5 1.4 - 7.0 x10E3/uL   Lymphocytes Absolute 4.8 (H) 0.7 - 3.1 x10E3/uL   Monocytes Absolute 0.7 0.1 - 0.9 x10E3/uL   EOS (ABSOLUTE) 0.2 0.0 - 0.4 x10E3/uL   Basophils Absolute 0.1 0.0 - 0.2 x10E3/uL   Immature Granulocytes 0 Not Estab. %   Immature Grans (Abs) 0.0 0.0 - 0.1 x10E3/uL  Comprehensive metabolic panel  Result Value Ref Range   Glucose 98 65 - 99 mg/dL   BUN 19 8 - 27 mg/dL   Creatinine, Ser 1.08 (H) 0.57 - 1.00 mg/dL   eGFR 52 (L) >59 mL/min/1.73   BUN/Creatinine Ratio 18 12 - 28   Sodium 139 134 - 144 mmol/L   Potassium 4.4 3.5 - 5.2 mmol/L   Chloride 101 96 - 106 mmol/L   CO2 25 20 - 29 mmol/L   Calcium 9.8 8.7 - 10.3 mg/dL   Total Protein 7.8 6.0 - 8.5 g/dL   Albumin 4.4 3.6 - 4.6 g/dL   Globulin, Total 3.4 1.5 - 4.5 g/dL   Albumin/Globulin Ratio 1.3 1.2 - 2.2   Bilirubin Total 0.3 0.0 - 1.2 mg/dL   Alkaline Phosphatase 75 44 - 121 IU/L   AST 18 0 - 40 IU/L   ALT 17 0 - 32 IU/L  Lipid Panel w/o Chol/HDL Ratio  Result Value Ref Range   Cholesterol, Total 181 100 - 199 mg/dL   Triglycerides 85 0 - 149 mg/dL   HDL 60 >39 mg/dL   VLDL Cholesterol Cal 16 5 - 40 mg/dL   LDL Chol Calc (NIH) 105 (H) 0 - 99 mg/dL  Urinalysis, Routine w reflex microscopic  Result Value Ref Range   Specific Gravity, UA 1.010 1.005 - 1.030   pH,  UA 5.5 5.0 - 7.5   Color, UA Yellow Yellow   Appearance Ur Clear Clear   Leukocytes,UA 1+ (A) Negative   Protein,UA Negative Negative/Trace   Glucose, UA Negative Negative   Ketones, UA Negative Negative   RBC, UA Trace (A) Negative   Bilirubin, UA Negative Negative   Urobilinogen, Ur 0.2 0.2 - 1.0 mg/dL   Nitrite, UA Negative Negative    Microscopic Examination See below:   TSH  Result Value Ref Range   TSH 4.550 (H) 0.450 - 4.500 uIU/mL        Current Outpatient Medications:  .  aspirin EC 81 MG tablet, Take 81 mg by mouth daily., Disp: , Rfl:  .  calcium-vitamin D 250-100 MG-UNIT per tablet, Take 1 tablet by mouth 2 (two) times daily., Disp: , Rfl:  .  meclizine (ANTIVERT) 25 MG tablet, Take 1 tablet (25 mg total) by mouth 3 (three) times daily as needed., Disp: 90 tablet, Rfl: 3 .  Multiple Vitamin (MULTI-VITAMINS) TABS, Take by mouth., Disp: , Rfl:  .  mupirocin cream (BACTROBAN) 2 %, Apply 1 application topically 2 (two) times daily., Disp: 15 g, Rfl: 0 .  omeprazole (PRILOSEC) 20 MG capsule, Take 1 capsule (20 mg total) by mouth 2 (two) times daily., Disp: 180 capsule, Rfl: 1 .  simvastatin (ZOCOR) 20 MG tablet, Take 1 tablet (20 mg total) by mouth daily., Disp: 90 tablet, Rfl: 0    Assessment & Plan:  Cardiac murmer with pedal edema / ? CHF   Check ECHO  PHYSICAL:  will check CMP, FLP, CBC,TSH, PSA.   Orders Placed This Encounter  Procedures  . DG Bone Density  . Lipid panel  . Basic Metabolic Panel (BMET)  . ECHOCARDIOGRAM COMPLETE     [facility] : Mammogram: 2022 DEXA: PENDING

## 2020-12-20 ENCOUNTER — Telehealth: Payer: Self-pay

## 2020-12-20 ENCOUNTER — Ambulatory Visit: Payer: PPO

## 2020-12-20 NOTE — Telephone Encounter (Signed)
This nurse attempted to call patient three times in regards to scheduled telephonic AWV. Called at [place], [phone], and [phone]. All calls went straight to voicemail. Message left for patient to call in order to reschedule or we will call her in order to reschedule.

## 2021-01-04 ENCOUNTER — Other Ambulatory Visit: Payer: Self-pay

## 2021-01-04 ENCOUNTER — Other Ambulatory Visit: Payer: PPO

## 2021-01-04 DIAGNOSIS — Z13820 Encounter for screening for osteoporosis: Secondary | ICD-10-CM | POA: Diagnosis not present

## 2021-01-04 DIAGNOSIS — R011 Cardiac murmur, unspecified: Secondary | ICD-10-CM | POA: Diagnosis not present

## 2021-01-04 DIAGNOSIS — I83893 Varicose veins of bilateral lower extremities with other complications: Secondary | ICD-10-CM

## 2021-01-04 DIAGNOSIS — E7800 Pure hypercholesterolemia, unspecified: Secondary | ICD-10-CM

## 2021-01-05 LAB — BASIC METABOLIC PANEL
BUN/Creatinine Ratio: 18 (ref 12–28)
BUN: 20 mg/dL (ref 8–27)
CO2: 25 mmol/L (ref 20–29)
Calcium: 10 mg/dL (ref 8.7–10.3)
Chloride: 99 mmol/L (ref 96–106)
Creatinine, Ser: 1.11 mg/dL — ABNORMAL HIGH (ref 0.57–1.00)
Glucose: 108 mg/dL — ABNORMAL HIGH (ref 65–99)
Potassium: 4.7 mmol/L (ref 3.5–5.2)
Sodium: 139 mmol/L (ref 134–144)
eGFR: 50 mL/min/{1.73_m2} — ABNORMAL LOW (ref >59)

## 2021-01-05 LAB — LIPID PANEL
Chol/HDL Ratio: 3 ratio (ref 0.0–4.4)
Cholesterol, Total: 192 mg/dL (ref 100–199)
HDL: 65 mg/dL (ref >39)
LDL Chol Calc (NIH): 110 mg/dL — ABNORMAL HIGH (ref 0–99)
Triglycerides: 94 mg/dL (ref 0–149)
VLDL Cholesterol Cal: 17 mg/dL (ref 5–40)

## 2021-01-06 ENCOUNTER — Other Ambulatory Visit: Payer: Self-pay | Admitting: Internal Medicine

## 2021-01-11 ENCOUNTER — Encounter: Payer: Self-pay | Admitting: Internal Medicine

## 2021-01-11 ENCOUNTER — Telehealth (INDEPENDENT_AMBULATORY_CARE_PROVIDER_SITE_OTHER): Payer: PPO | Admitting: Internal Medicine

## 2021-01-11 ENCOUNTER — Other Ambulatory Visit: Payer: Self-pay

## 2021-01-11 DIAGNOSIS — R059 Cough, unspecified: Secondary | ICD-10-CM

## 2021-01-11 DIAGNOSIS — Z20822 Contact with and (suspected) exposure to covid-19: Secondary | ICD-10-CM | POA: Diagnosis not present

## 2021-01-11 LAB — VERITOR FLU A/B WAIVED
Influenza A: NEGATIVE
Influenza B: NEGATIVE

## 2021-01-11 MED ORDER — AZITHROMYCIN 250 MG PO TABS: ORAL_TABLET | ORAL | 0 refills | Status: AC

## 2021-01-11 MED ORDER — FEXOFENADINE HCL 180 MG PO TABS: 180.0000 mg | ORAL_TABLET | Freq: Every day | ORAL | 1 refills | Status: DC

## 2021-01-11 NOTE — Progress Notes (Signed)
There were no vitals taken for this visit.   Subjective:    Patient ID: Brianna Stevens, female    DOB: July 26, 1940, 80 y.o.   MRN: [id number]  Chief Complaint  Patient presents with   Cough   Sore Throat   Chills   Nasal Congestion    All since lasts Thursday     HPI: MATTHEW PAIS is a 80 y.o. female   This visit was completed via video visit through MyChart due to the restrictions of the COVID-19 pandemic. All issues as above were discussed and addressed. Physical exam was done as above through visual confirmation on video through MyChart. If it was felt that the patient should be evaluated in the office, they were directed there. The patient verbally consented to this visit. Location of the patient: home Location of the provider: work Those involved with this call:  Provider: Charlynne Cousins, MD CMA: Frazier Butt, [place] Desk/Registration: Jill Side  Time spent on call: 10 minutes with patient face to face via video conference. More than 50% of this time was spent in counseling and coordination of care. 10 minutes total spent in review of patient's record and preparation of their chart.  Symptoms started last Tuesday and feeling a little better. Worse symptoms is sorethroat,and coughing.  Cough This is a new (coughing up greenish phelgm) problem. The problem has been gradually improving. The cough is Productive of purulent sputum. Associated symptoms include chills, nasal congestion and a sore throat. Pertinent negatives include no chest pain, ear congestion, ear pain, fever, headaches, heartburn, hemoptysis, myalgias, postnasal drip, rash, rhinorrhea, shortness of breath, sweats, weight loss or wheezing.  Sore Throat  Associated symptoms include coughing. Pertinent negatives include no ear pain, headaches or shortness of breath.   Chief Complaint  Patient presents with   Cough   Sore Throat   Chills   Nasal Congestion    All since lasts Thursday     Relevant  past medical, surgical, family and social history reviewed and updated as indicated. Interim medical history since our last visit reviewed. Allergies and medications reviewed and updated.  Review of Systems  Constitutional:  Positive for chills. Negative for fever and weight loss.  HENT:  Positive for sore throat. Negative for ear pain, postnasal drip and rhinorrhea.   Respiratory:  Positive for cough. Negative for hemoptysis, shortness of breath and wheezing.   Cardiovascular:  Negative for chest pain.  Gastrointestinal:  Negative for heartburn.  Musculoskeletal:  Negative for myalgias.  Skin:  Negative for rash.  Neurological:  Negative for headaches.   Per HPI unless specifically indicated above     Objective:    There were no vitals taken for this visit.  Wt Readings from Last 3 Encounters:  12/14/20 150 lb 8 oz (68.3 kg)  11/24/20 150 lb 3.2 oz (68.1 kg)  12/15/19 150 lb (68 kg)    Physical Exam Unable to peform sec to virtual visit.   Results for orders placed or performed in visit on 54/62/70  Basic Metabolic Panel (BMET)  Result Value Ref Range   Glucose 108 (H) 65 - 99 mg/dL   BUN 20 8 - 27 mg/dL   Creatinine, Ser 1.11 (H) 0.57 - 1.00 mg/dL   eGFR 50 (L) >59 mL/min/1.73   BUN/Creatinine Ratio 18 12 - 28   Sodium 139 134 - 144 mmol/L   Potassium 4.7 3.5 - 5.2 mmol/L   Chloride 99 96 - 106 mmol/L   CO2 25 20 -  29 mmol/L   Calcium 10.0 8.7 - 10.3 mg/dL  Lipid panel  Result Value Ref Range   Cholesterol, Total 192 100 - 199 mg/dL   Triglycerides 94 0 - 149 mg/dL   HDL 65 >39 mg/dL   VLDL Cholesterol Cal 17 5 - 40 mg/dL   LDL Chol Calc (NIH) 110 (H) 0 - 99 mg/dL   Chol/HDL Ratio 3.0 0.0 - 4.4 ratio        Current Outpatient Medications:    aspirin EC 81 MG tablet, Take 81 mg by mouth daily., Disp: , Rfl:    calcium-vitamin D 250-100 MG-UNIT per tablet, Take 1 tablet by mouth 2 (two) times daily., Disp: , Rfl:    hydrochlorothiazide (HYDRODIURIL) 12.5 MG  tablet, TAKE 1 TABLET BY MOUTH EVERY DAY, Disp: 30 tablet, Rfl: 1   meclizine (ANTIVERT) 25 MG tablet, Take 1 tablet (25 mg total) by mouth 3 (three) times daily as needed., Disp: 90 tablet, Rfl: 3   Multiple Vitamin (MULTI-VITAMINS) TABS, Take by mouth., Disp: , Rfl:    mupirocin cream (BACTROBAN) 2 %, Apply 1 application topically 2 (two) times daily., Disp: 15 g, Rfl: 0   omeprazole (PRILOSEC) 20 MG capsule, Take 1 capsule (20 mg total) by mouth 2 (two) times daily., Disp: 180 capsule, Rfl: 1   simvastatin (ZOCOR) 20 MG tablet, Take 1 tablet (20 mg total) by mouth daily., Disp: 90 tablet, Rfl: 0    Assessment & Plan:  COVID : to take a home COVID  Increase fluid intake.  Headahce - tyelnol every 4-6 hrs prn and alternate this with ibubrufen 800 mg q 8 hrly. OTC -  Allegra / claritin.  5 days quarantine.  Ok to rtw in 5 days if tests -ve follow.    Problem List Items Addressed This Visit   None    No orders of the defined types were placed in this encounter.    Meds ordered this encounter  Medications   azithromycin (ZITHROMAX) 250 MG tablet    Sig: Take 2 tablets on day 1, then 1 tablet daily on days 2 through 5    Dispense:  6 tablet    Refill:  0   fexofenadine (ALLEGRA ALLERGY) 180 MG tablet    Sig: Take 1 tablet (180 mg total) by mouth daily.    Dispense:  10 tablet    Refill:  1     Follow up plan: No follow-ups on file.

## 2021-01-12 LAB — NOVEL CORONAVIRUS, NAA: SARS-CoV-2, NAA: NOT DETECTED

## 2021-01-12 LAB — SARS-COV-2, NAA 2 DAY TAT

## 2021-01-12 NOTE — Progress Notes (Signed)
Please let pt know this was normal.

## 2021-01-17 ENCOUNTER — Encounter: Payer: Self-pay | Admitting: Internal Medicine

## 2021-02-02 ENCOUNTER — Other Ambulatory Visit: Payer: Self-pay | Admitting: Internal Medicine

## 2021-02-18 ENCOUNTER — Other Ambulatory Visit: Payer: Self-pay | Admitting: Family Medicine

## 2021-02-18 DIAGNOSIS — E7800 Pure hypercholesterolemia, unspecified: Secondary | ICD-10-CM

## 2021-02-18 NOTE — Telephone Encounter (Signed)
Requested medications are due for refill today yes  Requested medications are on the active medication list yes  Last refill 5/25  Last visit 6/14  Future visit scheduled no  Notes to clinic Pharm requesting diagnosis code.

## 2021-02-21 NOTE — Telephone Encounter (Signed)
Patient last seen 12/14/20

## 2021-02-28 ENCOUNTER — Other Ambulatory Visit: Payer: Self-pay | Admitting: Internal Medicine

## 2021-02-28 NOTE — Telephone Encounter (Signed)
Requested Prescriptions  Pending Prescriptions Disp Refills  . hydrochlorothiazide (HYDRODIURIL) 12.5 MG tablet [Pharmacy Med Name: HYDROCHLOROTHIAZIDE 12.5 MG TB] 30 tablet 1    Sig: TAKE 1 TABLET BY MOUTH EVERY DAY     Cardiovascular: Diuretics - Thiazide Failed - 02/28/2021 12:30 PM      Failed - Cr in normal range and within 360 days    Creatinine, Ser  Date Value Ref Range Status  01/04/2021 1.11 (H) 0.57 - 1.00 mg/dL Final         Passed - Ca in normal range and within 360 days    Calcium  Date Value Ref Range Status  01/04/2021 10.0 8.7 - 10.3 mg/dL Final         Passed - K in normal range and within 360 days    Potassium  Date Value Ref Range Status  01/04/2021 4.7 3.5 - 5.2 mmol/L Final         Passed - Na in normal range and within 360 days    Sodium  Date Value Ref Range Status  01/04/2021 139 134 - 144 mmol/L Final         Passed - Last BP in normal range    BP Readings from Last 1 Encounters:  12/14/20 134/85         Passed - Valid encounter within last 6 months    Recent Outpatient Visits          1 month ago Suspected COVID-19 virus infection   [facility] Vigg, Avanti, MD   2 months ago Screening for osteoporosis   [place] Vigg, Avanti, MD   3 months ago Reflux gastritis   [employer], [place], DO   1 year ago [place], [place] T, NP   3 years ago Bilateral hearing loss due to cerumen impaction   [facility] [place], Jeannette How, MD

## 2021-04-28 ENCOUNTER — Other Ambulatory Visit: Payer: Self-pay | Admitting: Obstetrics and Gynecology

## 2021-04-28 DIAGNOSIS — Z01419 Encounter for gynecological examination (general) (routine) without abnormal findings: Secondary | ICD-10-CM | POA: Diagnosis not present

## 2021-04-28 DIAGNOSIS — Z1239 Encounter for other screening for malignant neoplasm of breast: Secondary | ICD-10-CM | POA: Diagnosis not present

## 2021-04-28 DIAGNOSIS — Z1211 Encounter for screening for malignant neoplasm of colon: Secondary | ICD-10-CM | POA: Diagnosis not present

## 2021-04-28 DIAGNOSIS — Z124 Encounter for screening for malignant neoplasm of cervix: Secondary | ICD-10-CM | POA: Diagnosis not present

## 2021-04-28 DIAGNOSIS — Z1231 Encounter for screening mammogram for malignant neoplasm of breast: Secondary | ICD-10-CM

## 2021-05-17 DIAGNOSIS — Z1211 Encounter for screening for malignant neoplasm of colon: Secondary | ICD-10-CM | POA: Diagnosis not present

## 2021-06-06 ENCOUNTER — Ambulatory Visit (INDEPENDENT_AMBULATORY_CARE_PROVIDER_SITE_OTHER): Payer: PPO | Admitting: *Deleted

## 2021-06-06 DIAGNOSIS — Z0000 Encounter for general adult medical examination without abnormal findings: Secondary | ICD-10-CM

## 2021-06-06 NOTE — Patient Instructions (Signed)
Brianna Stevens , Thank you for taking time to come for your Medicare Wellness Visit. I appreciate your ongoing commitment to your health goals. Please review the following plan we discussed and let me know if I can assist you in the future.   Screening recommendations/referrals: Colonoscopy: no longer required Mammogram: up to date Bone Density: up to date Recommended yearly ophthalmology/optometry visit for glaucoma screening and checkup Recommended yearly dental visit for hygiene and checkup  Vaccinations: Influenza vaccine: Education provided Pneumococcal vaccine: up to date Tdap vaccine: Education provided Shingles vaccine: Education provided    Advanced directives: Education provided  Conditions/risks identified:   Next appointment:    Preventive Care 81 Years and Older, Female Preventive care refers to lifestyle choices and visits with your health care provider that can promote health and wellness. What does preventive care include? A yearly physical exam. This is also called an annual well check. Dental exams once or twice a year. Routine eye exams. Ask your health care provider how often you should have your eyes checked. Personal lifestyle choices, including: Daily care of your teeth and gums. Regular physical activity. Eating a healthy diet. Avoiding tobacco and drug use. Limiting alcohol use. Practicing safe sex. Taking low-dose aspirin every day. Taking vitamin and mineral supplements as recommended by your health care provider. What happens during an annual well check? The services and screenings done by your health care provider during your annual well check will depend on your age, overall health, lifestyle risk factors, and family history of disease. Counseling  Your health care provider may ask you questions about your: Alcohol use. Tobacco use. Drug use. Emotional well-being. Home and relationship well-being. Sexual activity. Eating habits. History of  falls. Memory and ability to understand (cognition). Work and work Statistician. Reproductive health. Screening  You may have the following tests or measurements: Height, weight, and BMI. Blood pressure. Lipid and cholesterol levels. These may be checked every 5 years, or more frequently if you are over 28 years old. Skin check. Lung cancer screening. You may have this screening every year starting at age 81 if you have a 30-pack-year history of smoking and currently smoke or have quit within the past 15 years. Fecal occult blood test (FOBT) of the stool. You may have this test every year starting at age 15. Flexible sigmoidoscopy or colonoscopy. You may have a sigmoidoscopy every 5 years or a colonoscopy every 10 years starting at age 38. Hepatitis C blood test. Hepatitis B blood test. Sexually transmitted disease (STD) testing. Diabetes screening. This is done by checking your blood sugar (glucose) after you have not eaten for a while (fasting). You may have this done every 1-3 years. Bone density scan. This is done to screen for osteoporosis. You may have this done starting at age 79. Mammogram. This may be done every 1-2 years. Talk to your health care provider about how often you should have regular mammograms. Talk with your health care provider about your test results, treatment options, and if necessary, the need for more tests. Vaccines  Your health care provider may recommend certain vaccines, such as: Influenza vaccine. This is recommended every year. Tetanus, diphtheria, and acellular pertussis (Tdap, Td) vaccine. You may need a Td booster every 10 years. Zoster vaccine. You may need this after age 39. Pneumococcal 13-valent conjugate (PCV13) vaccine. One dose is recommended after age 78. Pneumococcal polysaccharide (PPSV23) vaccine. One dose is recommended after age 54. Talk to your health care provider about which screenings and vaccines  you need and how often you need  them. This information is not intended to replace advice given to you by your health care provider. Make sure you discuss any questions you have with your health care provider. Document Released: 07/16/2015 Document Revised: 03/08/2016 Document Reviewed: 04/20/2015 Elsevier Interactive Patient Education  2017 [place] Prevention in the Home Falls can cause injuries. They can happen to people of all ages. There are many things you can do to make your home safe and to help prevent falls. What can I do on the outside of my home? Regularly fix the edges of walkways and driveways and fix any cracks. Remove anything that might make you trip as you walk through a door, such as a raised step or threshold. Trim any bushes or trees on the path to your home. Use bright outdoor lighting. Clear any walking paths of anything that might make someone trip, such as rocks or tools. Regularly check to see if handrails are loose or broken. Make sure that both sides of any steps have handrails. Any raised decks and porches should have guardrails on the edges. Have any leaves, snow, or ice cleared regularly. Use sand or salt on walking paths during winter. Clean up any spills in your garage right away. This includes oil or grease spills. What can I do in the bathroom? Use night lights. Install grab bars by the toilet and in the tub and shower. Do not use towel bars as grab bars. Use non-skid mats or decals in the tub or shower. If you need to sit down in the shower, use a plastic, non-slip stool. Keep the floor dry. Clean up any water that spills on the floor as soon as it happens. Remove soap buildup in the tub or shower regularly. Attach bath mats securely with double-sided non-slip rug tape. Do not have throw rugs and other things on the floor that can make you trip. What can I do in the bedroom? Use night lights. Make sure that you have a light by your bed that is easy to reach. Do not use  any sheets or blankets that are too big for your bed. They should not hang down onto the floor. Have a firm chair that has side arms. You can use this for support while you get dressed. Do not have throw rugs and other things on the floor that can make you trip. What can I do in the kitchen? Clean up any spills right away. Avoid walking on wet floors. Keep items that you use a lot in easy-to-reach places. If you need to reach something above you, use a strong step stool that has a grab bar. Keep electrical cords out of the way. Do not use floor polish or wax that makes floors slippery. If you must use wax, use non-skid floor wax. Do not have throw rugs and other things on the floor that can make you trip. What can I do with my stairs? Do not leave any items on the stairs. Make sure that there are handrails on both sides of the stairs and use them. Fix handrails that are broken or loose. Make sure that handrails are as long as the stairways. Check any carpeting to make sure that it is firmly attached to the stairs. Fix any carpet that is loose or worn. Avoid having throw rugs at the top or bottom of the stairs. If you do have throw rugs, attach them to the floor with carpet tape. Make sure  that you have a light switch at the top of the stairs and the bottom of the stairs. If you do not have them, ask someone to add them for you. What else can I do to help prevent falls? Wear shoes that: Do not have high heels. Have rubber bottoms. Are comfortable and fit you well. Are closed at the toe. Do not wear sandals. If you use a stepladder: Make sure that it is fully opened. Do not climb a closed stepladder. Make sure that both sides of the stepladder are locked into place. Ask someone to hold it for you, if possible. Clearly mark and make sure that you can see: Any grab bars or handrails. First and last steps. Where the edge of each step is. Use tools that help you move around (mobility aids)  if they are needed. These include: Canes. Walkers. Scooters. Crutches. Turn on the lights when you go into a dark area. Replace any light bulbs as soon as they burn out. Set up your furniture so you have a clear path. Avoid moving your furniture around. If any of your floors are uneven, fix them. If there are any pets around you, be aware of where they are. Review your medicines with your doctor. Some medicines can make you feel dizzy. This can increase your chance of falling. Ask your doctor what other things that you can do to help prevent falls. This information is not intended to replace advice given to you by your health care provider. Make sure you discuss any questions you have with your health care provider. Document Released: 04/15/2009 Document Revised: 11/25/2015 Document Reviewed: 07/24/2014 Elsevier Interactive Patient Education  2017 [employer].

## 2021-06-06 NOTE — Progress Notes (Signed)
Subjective:   Brianna Stevens is a 80 y.o. female who presents for Medicare Annual (Subsequent) preventive examination.  I connected with  [name] on 06/06/21 by a telephone enabled telemedicine application and verified that I am speaking with the correct person using two identifiers.   I discussed the limitations of evaluation and management by telemedicine. The patient expressed understanding and agreed to proceed.  Patient location: home  Provider location:  Tele-health  not in clinic    Review of Systems     Cardiac Risk Factors include: advanced age (>75men, >76 women);hypertension     Objective:    Today's Vitals   There is no height or weight on file to calculate BMI.  Advanced Directives 06/06/2021 12/15/2019 09/10/2019 12/25/2018 12/17/2017 11/29/2016  Does Patient Have a Medical Advance Directive? No No No Yes Yes Yes  Type of Advance Directive - - - Living will;Healthcare Power of Attorney Living will;Healthcare Power of [place];Living will  Does patient want to make changes to medical advance directive? - Yes (MAU/Ambulatory/Procedural Areas - Information given) - - - -  Copy of [place] in Chart? - - - No - copy requested No - copy requested No - copy requested  Would patient like information on creating a medical advance directive? No - Patient declined - - - - -    Current Medications (verified) Outpatient Encounter Medications as of 06/06/2021  Medication Sig   aspirin EC 81 MG tablet Take 81 mg by mouth daily.   calcium-vitamin D 250-100 MG-UNIT per tablet Take 1 tablet by mouth 2 (two) times daily.   hydrochlorothiazide (HYDRODIURIL) 12.5 MG tablet TAKE 1 TABLET BY MOUTH EVERY DAY   meclizine (ANTIVERT) 25 MG tablet Take 1 tablet (25 mg total) by mouth 3 (three) times daily as needed.   mupirocin cream (BACTROBAN) 2 % Apply 1 application topically 2 (two) times daily.   omeprazole (PRILOSEC) 20 MG capsule  Take 1 capsule (20 mg total) by mouth 2 (two) times daily.   simvastatin (ZOCOR) 20 MG tablet TAKE 1 TABLET (20 MG TOTAL) BY MOUTH DAILY. PLEASE CALL FOR AN APPOINTMENT FOR MORE REFILLS   fexofenadine (ALLEGRA ALLERGY) 180 MG tablet Take 1 tablet (180 mg total) by mouth daily. (Patient not taking: Reported on 06/06/2021)   Multiple Vitamin (MULTI-VITAMINS) TABS Take by mouth. (Patient not taking: Reported on 06/06/2021)   No facility-administered encounter medications on file as of 06/06/2021.    Allergies (verified) Pantoprazole   History: Past Medical History:  Diagnosis Date   GERD (gastroesophageal reflux disease)    Hx of adenomatous colonic polyps    Hyperlipidemia    Past Surgical History:  Procedure Laterality Date   APPENDECTOMY     BREAST CYST ASPIRATION Bilateral    CHOLECYSTECTOMY     COLONOSCOPY     COLONOSCOPY WITH PROPOFOL N/A 09/10/2019   Procedure: COLONOSCOPY WITH PROPOFOL;  Surgeon: Toledo, Benay Pike, MD;  Location: [facility] ENDOSCOPY;  Service: Gastroenterology;  Laterality: N/A;   ESOPHAGOGASTRODUODENOSCOPY     FLEXIBLE SIGMOIDOSCOPY     VARICOSE VEIN SURGERY Bilateral    done at [facility]   Family History  Problem Relation Age of Onset   Prostate cancer Brother 27   Prostate cancer Brother 63   Breast cancer Neg Hx    Social History   Socioeconomic History   Marital status: Widowed    Spouse name: Not on file   Number of children: Not  on file   Years of education: 13   Highest education level: Some college, no degree  Occupational History   Occupation: retired  Tobacco Use   Smoking status: Never   Smokeless tobacco: Never  Vaping Use   Vaping Use: Never used  Substance and Sexual Activity   Alcohol use: Yes    Alcohol/week: 1.0 standard drink    Types: 1 Glasses of wine per week    Comment: social   Drug use: No   Sexual activity: Not Currently  Other Topics Concern   Not on file  Social History Narrative   Senior citizens  activities    Social Determinants of Health   Financial Resource Strain: Low Risk    Difficulty of Paying Living Expenses: Not hard at all  Food Insecurity: No Food Insecurity   Worried About Charity fundraiser in the Last Year: Never true   Arboriculturist in the Last Year: Never true  Transportation Needs: No Transportation Needs   Lack of Transportation (Medical): No   Lack of Transportation (Non-Medical): No  Physical Activity: Sufficiently Active   Days of Exercise per Week: 6 days   Minutes of Exercise per Session: 50 min  Stress: No Stress Concern Present   Feeling of Stress : Not at all  Social Connections: Moderately Isolated   Frequency of Communication with Friends and Family: More than three times a week   Frequency of Social Gatherings with Friends and Family: Three times a week   Attends Religious Services: More than 4 times per year   Active Member of Clubs or Organizations: No   Attends Archivist Meetings: Never   Marital Status: Widowed    Tobacco Counseling Counseling given: Not Answered   Clinical Intake:  Pre-visit preparation completed: Yes  Pain : No/denies pain     Nutritional Risks: None Diabetes: No  How often do you need to have someone help you when you read instructions, pamphlets, or other written materials from your doctor or pharmacy?: 1 - Never  Diabetic?  no  Interpreter Needed?: No  Information entered by :: Leroy Kennedy LPN   Activities of Daily Living In your present state of health, do you have any difficulty performing the following activities: 06/06/2021  Hearing? N  Vision? N  Difficulty concentrating or making decisions? N  Walking or climbing stairs? N  Dressing or bathing? N  Doing errands, shopping? N  Preparing Food and eating ? N  Using the Toilet? N  In the past six months, have you accidently leaked urine? N  Do you have problems with loss of bowel control? N  Managing your Medications? N   Managing your Finances? N  Housekeeping or managing your Housekeeping? N  Some recent data might be hidden    Patient Care Team: Charlynne Cousins, MD as PCP - General (Internal Medicine) Schermerhorn, Gwen Her, MD as Referring Physician (Obstetrics and Gynecology)  Indicate any recent Medical Services you may have received from other than [facility] providers in the past year (date may be approximate).     Assessment:   This is a routine wellness examination for [facility].  Hearing/Vision screen Hearing Screening - Comments:: No trouble hearing Vision Screening - Comments:: Not up to date [facility]   Dietary issues and exercise activities discussed: Current Exercise Habits: Home exercise routine, Type of exercise: walking;strength training/weights;stretching, Time (Minutes): 40, Frequency (Times/Week): 6, Weekly Exercise (Minutes/Week): 240, Intensity: Moderate   Goals Addressed  This Visit's Progress    DIET - INCREASE WATER INTAKE   On track    Recommend continue drinking at least 6-8 glasses of water a day      Weight (lb) < 200 lb (90.7 kg)         Depression Screen PHQ 2/9 Scores 06/06/2021 01/11/2021 12/14/2020 12/15/2019 09/01/2019 12/25/2018 12/17/2017  PHQ - 2 Score 0 0 0 0 0 0 0  PHQ- 9 Score - 0 0 - 2 - -    Fall Risk Fall Risk  06/06/2021 01/11/2021 12/14/2020 12/15/2019 09/01/2019  Falls in the past year? 0 1 0 0 0  Comment - - - - -  Number falls in past yr: 0 0 0 0 0  Comment - - - - -  Injury with Fall? 0 1 0 0 0  Risk for fall due to : - Other (Comment) No Fall Risks - -  Follow up Falls evaluation completed;Falls prevention discussed Falls evaluation completed Falls evaluation completed - -    FALL RISK PREVENTION PERTAINING TO THE HOME:  Any stairs in or around the home? Yes  If so, are there any without handrails? No  Home free of loose throw rugs in walkways, pet beds, electrical cords, etc? Yes  Adequate lighting in your home to reduce risk of falls? Yes    ASSISTIVE DEVICES UTILIZED TO PREVENT FALLS:  Life alert? No  Use of a cane, walker or w/c? No  Grab bars in the bathroom? Yes  Shower chair or bench in shower? Yes  Elevated toilet seat or a handicapped toilet? Yes   TIMED UP AND GO:  Was the test performed? No .    Cognitive Function:    Normal cognitive status assessed by direct observation by this Nurse Health Advisor. No abnormalities found.     6CIT Screen 12/25/2018 12/17/2017 11/29/2016  What Year? 0 points 0 points 0 points  What month? 0 points 0 points 0 points  What time? 0 points 0 points 0 points  Count back from 20 0 points 0 points 0 points  Months in reverse 0 points 0 points 2 points  Repeat phrase 0 points 4 points 2 points  Total Score 0 4 4    Immunizations Immunization History  Administered Date(s) Administered   Influenza, High Dose Seasonal PF 04/04/2017   Influenza-Unspecified 04/04/2017, 04/24/2019   PFIZER(Purple Top)SARS-COV-2 Vaccination 08/29/2019, 09/23/2019, 07/16/2020   Pneumococcal Conjugate-13 07/03/2009   Pneumococcal Polysaccharide-23 07/03/2010    TDAP status: Due, Education has been provided regarding the importance of this vaccine. Advised may receive this vaccine at local pharmacy or Health Dept. Aware to provide a copy of the vaccination record if obtained from local pharmacy or Health Dept. Verbalized acceptance and understanding.  Flu Vaccine status: Due, Education has been provided regarding the importance of this vaccine. Advised may receive this vaccine at local pharmacy or Health Dept. Aware to provide a copy of the vaccination record if obtained from local pharmacy or Health Dept. Verbalized acceptance and understanding.  Pneumococcal vaccine status: Up to date  Covid-19 vaccine status: Completed vaccines  Qualifies for Shingles Vaccine? Yes   Zostavax completed No   Shingrix Completed?: No.    Education has been provided regarding the importance of this vaccine.  Patient has been advised to call insurance company to determine out of pocket expense if they have not yet received this vaccine. Advised may also receive vaccine at local pharmacy or Health Dept. Verbalized acceptance and understanding.  Screening  Tests Health Maintenance  Topic Date Due   Zoster Vaccines- Shingrix (1 of 2) Never done   COVID-19 Vaccine (4 - Booster for Pfizer series) 09/10/2020   INFLUENZA VACCINE  01/31/2021   TETANUS/TDAP  12/14/2021 (Originally 07/20/1959)   Pneumonia Vaccine 64+ Years old  Completed   DEXA SCAN  Completed   HPV VACCINES  Aged Out    Health Maintenance  Health Maintenance Due  Topic Date Due   Zoster Vaccines- Shingrix (1 of 2) Never done   COVID-19 Vaccine (4 - Booster for Pfizer series) 09/10/2020   INFLUENZA VACCINE  01/31/2021    Colorectal cancer screening: No longer required.   Mammogram status: Completed  . Repeat every year  Bone Density status: Completed  . Results reflect: Bone density results: NORMAL. Repeat every   years.  Lung Cancer Screening: (Low Dose CT Chest recommended if Age 50-80 years, 30 pack-year currently smoking OR have quit w/in 15years.) does not qualify.   Lung Cancer Screening Referral:   Additional Screening:  Hepatitis C Screening: does not qualify;   Vision Screening: Recommended annual ophthalmology exams for early detection of glaucoma and other disorders of the eye. Is the patient up to date with their annual eye exam?  No  Who is the provider or what is the name of the office in which the patient attends annual eye exams? [facility] If pt is not established with a provider, would they like to be referred to a provider to establish care? No .   Dental Screening: Recommended annual dental exams for proper oral hygiene  Community Resource Referral / Chronic Care Management: CRR required this visit?  No   CCM required this visit?  No      Plan:     I have personally reviewed and noted the  following in the patient's chart:   Medical and social history Use of alcohol, tobacco or illicit drugs  Current medications and supplements including opioid prescriptions.  Functional ability and status Nutritional status Physical activity Advanced directives List of other physicians Hospitalizations, surgeries, and ER visits in previous 12 months Vitals Screenings to include cognitive, depression, and falls Referrals and appointments  In addition, I have reviewed and discussed with patient certain preventive protocols, quality metrics, and best practice recommendations. A written personalized care plan for preventive services as well as general preventive health recommendations were provided to patient.     Leroy Kennedy, LPN   29/03/2425   Nurse Notes:

## 2021-08-05 ENCOUNTER — Encounter: Payer: PPO | Admitting: Radiology

## 2021-08-30 ENCOUNTER — Other Ambulatory Visit: Payer: Self-pay

## 2021-08-30 ENCOUNTER — Telehealth: Payer: Self-pay | Admitting: Internal Medicine

## 2021-08-30 DIAGNOSIS — E7800 Pure hypercholesterolemia, unspecified: Secondary | ICD-10-CM

## 2021-08-30 NOTE — Telephone Encounter (Signed)
Patient is requesting for prescription refill to be sent to [facility] in [place], [place].  Pharmacy in [place], [place] closed and [facility], pharmacy stated provider needs to send in a new prescription.  Advised patient that a follow up appointment may be needed, patient declined appointment at this time.  She asked for a courtesy refill if possible.  Please contact patient at [phone] with any questions/concerns.  simvastatin (ZOCOR) 20 MG tablet [[id number]]  omeprazole (PRILOSEC) 20 MG capsule [[id number]]

## 2021-08-30 NOTE — Telephone Encounter (Signed)
Ok to refill needs to be seen in 3 months at least. Thnx

## 2021-08-30 NOTE — Telephone Encounter (Signed)
Done

## 2021-08-31 MED ORDER — OMEPRAZOLE 20 MG PO CPDR: 20.0000 mg | DELAYED_RELEASE_CAPSULE | Freq: Two times a day (BID) | ORAL | 1 refills | Status: DC

## 2021-08-31 MED ORDER — SIMVASTATIN 20 MG PO TABS: ORAL_TABLET | ORAL | 0 refills | Status: DC

## 2021-09-14 ENCOUNTER — Ambulatory Visit
Admission: RE | Admit: 2021-09-14 | Discharge: 2021-09-14 | Disposition: A | Discharge status: Home / Self Care | Payer: PPO | Source: Ambulatory Visit | Attending: Obstetrics and Gynecology | Admitting: Obstetrics and Gynecology

## 2021-09-14 ENCOUNTER — Other Ambulatory Visit: Payer: Self-pay

## 2021-09-14 DIAGNOSIS — Z1231 Encounter for screening mammogram for malignant neoplasm of breast: Secondary | ICD-10-CM | POA: Diagnosis not present

## 2021-11-27 ENCOUNTER — Other Ambulatory Visit: Payer: Self-pay | Admitting: Internal Medicine

## 2021-11-27 DIAGNOSIS — E7800 Pure hypercholesterolemia, unspecified: Secondary | ICD-10-CM

## 2021-11-29 NOTE — Telephone Encounter (Signed)
Requested medication (s) are due for refill today: yes  Requested medication (s) are on the active medication list: yes  Last refill:  08/31/21 #90/0  Future visit scheduled: yes scheduled for 12/06/21  Notes to clinic:  Unable to refill per protocol due to failed labs, no updated results.    Requested Prescriptions  Pending Prescriptions Disp Refills   simvastatin (ZOCOR) 20 MG tablet [Pharmacy Med Name: SIMVASTATIN 20 MG TABLET] 90 tablet 0    Sig: TAKE 1 TABLET (20 MG TOTAL) BY MOUTH DAILY. PLEASE CALL FOR AN APPOINTMENT FOR MORE REFILLS     Cardiovascular:  Antilipid - Statins Failed - 11/27/2021  9:23 AM      Failed - Lipid Panel in normal range within the last 12 months    Cholesterol, Total  Date Value Ref Range Status  01/04/2021 192 100 - 199 mg/dL Final   Cholesterol Piccolo, Waived  Date Value Ref Range Status  12/17/2017 172 <200 mg/dL Final    Comment:                            Desirable                <200                         Borderline High      200- 239                         High                     >239    LDL Chol Calc (NIH)  Date Value Ref Range Status  01/04/2021 110 (H) 0 - 99 mg/dL Final   HDL  Date Value Ref Range Status  01/04/2021 65 >39 mg/dL Final   Triglycerides  Date Value Ref Range Status  01/04/2021 94 0 - 149 mg/dL Final   Triglycerides Piccolo,Waived  Date Value Ref Range Status  12/17/2017 72 <150 mg/dL Final    Comment:                            Normal                   <150                         Borderline High     150 - 199                         High                200 - 499                         Very High                >499          Passed - Patient is not pregnant      Passed - Valid encounter within last 12 months    Recent Outpatient Visits           10 months ago Suspected COVID-19 virus infection   [facility] Vigg, Avanti, MD   11 months ago Screening for osteoporosis  [facility] Vigg, Avanti, MD   1 year ago Reflux gastritis   [facility], Echo, DO   2 years ago [place], Vassar T, NP   3 years ago Bilateral hearing loss due to cerumen impaction   [place], MD       Future Appointments             In 1 week Vigg, Avanti, MD [facility]

## 2021-12-06 ENCOUNTER — Encounter: Payer: Self-pay | Admitting: Internal Medicine

## 2021-12-06 ENCOUNTER — Ambulatory Visit (INDEPENDENT_AMBULATORY_CARE_PROVIDER_SITE_OTHER): Payer: PPO | Admitting: Internal Medicine

## 2021-12-06 VITALS — BP 140/87 | HR 61 | Temp 98.2°F | Ht 62.21 in | Wt 145.8 lb

## 2021-12-06 DIAGNOSIS — E785 Hyperlipidemia, unspecified: Secondary | ICD-10-CM | POA: Diagnosis not present

## 2021-12-06 DIAGNOSIS — E7800 Pure hypercholesterolemia, unspecified: Secondary | ICD-10-CM | POA: Diagnosis not present

## 2021-12-06 DIAGNOSIS — H6123 Impacted cerumen, bilateral: Secondary | ICD-10-CM

## 2021-12-06 DIAGNOSIS — R002 Palpitations: Secondary | ICD-10-CM

## 2021-12-06 LAB — URINALYSIS, ROUTINE W REFLEX MICROSCOPIC
Bilirubin, UA: NEGATIVE
Glucose, UA: NEGATIVE
Ketones, UA: NEGATIVE
Nitrite, UA: NEGATIVE
Protein,UA: NEGATIVE
Specific Gravity, UA: 1.015 (ref 1.005–1.030)
Urobilinogen, Ur: 1 mg/dL (ref 0.2–1.0)
pH, UA: 7 (ref 5.0–7.5)

## 2021-12-06 LAB — MICROSCOPIC EXAMINATION
Bacteria, UA: NONE SEEN
Epithelial Cells (non renal): NONE SEEN /hpf (ref 0–10)

## 2021-12-06 MED ORDER — LOSARTAN POTASSIUM 25 MG PO TABS: 25.0000 mg | ORAL_TABLET | Freq: Every day | ORAL | 3 refills | Status: DC

## 2021-12-06 MED ORDER — DEBROX 6.5 % OT SOLN: 5.0000 [drp] | Freq: Two times a day (BID) | OTIC | 0 refills | Status: DC

## 2021-12-06 NOTE — Progress Notes (Signed)
BP 140/87   Pulse 61   Temp 98.2 F (36.8 C) (Oral)   Ht 5' 2.21" (1.58 m)   Wt 145 lb 12.8 oz (66.1 kg)   SpO2 96%   BMI 26.49 kg/m    Subjective:    Patient ID: Brianna Stevens, female    DOB: 03/04/1941, 81 y.o.   MRN: [id number]  Chief Complaint  Patient presents with  . Hyperlipidemia    HPI: Brianna Stevens is a 81 y.o. female  Ho wax   Hyperlipidemia This is a chronic problem. The current episode started more than 1 year ago. She has no history of chronic renal disease, diabetes, hypothyroidism or obesity. Pertinent negatives include no chest pain, myalgias or shortness of breath.   Chief Complaint  Patient presents with  . Hyperlipidemia    Relevant past medical, surgical, family and social history reviewed and updated as indicated. Interim medical history since our last visit reviewed. Allergies and medications reviewed and updated.  Review of Systems  Constitutional:  Negative for activity change, appetite change, chills, fatigue and fever.  HENT:  Positive for hearing loss. Negative for congestion, ear discharge, ear pain, facial swelling, mouth sores, nosebleeds, postnasal drip, rhinorrhea, sinus pressure, sinus pain, sneezing and sore throat.   Eyes:  Negative for pain and itching.  Respiratory:  Negative for cough, chest tightness, shortness of breath and wheezing.   Cardiovascular:  Negative for chest pain, palpitations and leg swelling.  Gastrointestinal:  Negative for abdominal distention, abdominal pain, blood in stool, constipation, diarrhea, nausea and vomiting.  Endocrine: Negative for cold intolerance, heat intolerance, polydipsia, polyphagia and polyuria.  Genitourinary:  Negative for difficulty urinating, dysuria, flank pain, frequency, hematuria and urgency.  Musculoskeletal:  Negative for arthralgias, gait problem, joint swelling and myalgias.  Skin:  Negative for color change, rash and wound.  Neurological:  Negative for dizziness, tremors,  speech difficulty, weakness, light-headedness, numbness and headaches.  Hematological:  Does not bruise/bleed easily.  Psychiatric/Behavioral:  Negative for agitation, confusion, decreased concentration, sleep disturbance and suicidal ideas.    Per HPI unless specifically indicated above     Objective:    BP 140/87   Pulse 61   Temp 98.2 F (36.8 C) (Oral)   Ht 5' 2.21" (1.58 m)   Wt 145 lb 12.8 oz (66.1 kg)   SpO2 96%   BMI 26.49 kg/m   Wt Readings from Last 3 Encounters:  12/06/21 145 lb 12.8 oz (66.1 kg)  12/14/20 150 lb 8 oz (68.3 kg)  11/24/20 150 lb 3.2 oz (68.1 kg)    Physical Exam Vitals and nursing note reviewed.  Constitutional:      General: She is not in acute distress.    Appearance: Normal appearance. She is not ill-appearing or diaphoretic.  Eyes:     Conjunctiva/sclera: Conjunctivae normal.  Pulmonary:     Breath sounds: No rhonchi.  Abdominal:     General: Abdomen is flat. Bowel sounds are normal. There is no distension.     Palpations: Abdomen is soft. There is no mass.     Tenderness: There is no abdominal tenderness. There is no guarding.  Skin:    General: Skin is warm and dry.     Coloration: Skin is not jaundiced.     Findings: No erythema.  Neurological:     Mental Status: She is alert.    Results for orders placed or performed in visit on 01/11/21  Novel Coronavirus, NAA ([facility])   Specimen: Nasopharyngeal(NP) swabs  in vial transport medium  Result Value Ref Range   SARS-CoV-2, NAA Not Detected Not Detected  SARS-COV-2, NAA 2 DAY TAT  Result Value Ref Range   SARS-CoV-2, NAA 2 DAY TAT Performed   Influenza A & B (STAT)  Result Value Ref Range   Influenza A Negative Negative   Influenza B Negative Negative        Current Outpatient Medications:  .  aspirin EC 81 MG tablet, Take 81 mg by mouth daily., Disp: , Rfl:  .  calcium-vitamin D 250-100 MG-UNIT per tablet, Take 1 tablet by mouth 2 (two) times daily., Disp: , Rfl:  .   meclizine (ANTIVERT) 25 MG tablet, Take 1 tablet (25 mg total) by mouth 3 (three) times daily as needed., Disp: 90 tablet, Rfl: 3 .  Multiple Vitamin (MULTI-VITAMINS) TABS, Take by mouth., Disp: , Rfl:  .  mupirocin cream (BACTROBAN) 2 %, Apply 1 application topically 2 (two) times daily., Disp: 15 g, Rfl: 0 .  omeprazole (PRILOSEC) 20 MG capsule, Take 1 capsule (20 mg total) by mouth 2 (two) times daily., Disp: 180 capsule, Rfl: 1 .  simvastatin (ZOCOR) 20 MG tablet, TAKE 1 TABLET (20 MG TOTAL) BY MOUTH DAILY. PLEASE CALL FOR AN APPOINTMENT FOR MORE REFILLS, Disp: 90 tablet, Rfl: 0 .  hydrochlorothiazide (HYDRODIURIL) 12.5 MG tablet, TAKE 1 TABLET BY MOUTH EVERY DAY (Patient not taking: Reported on 12/06/2021), Disp: 30 tablet, Rfl: 1    Assessment & Plan:  Cerumen impaction Will rx debrox ear drops for each ear.  To get cerumen disimpaction later this week  2. HLD :  recheck FLP, check LFT's work on diet, SE of meds explained to pt. low fat and high fiber diet explained to pt.  Problem List Items Addressed This Visit   None

## 2021-12-07 LAB — COMPREHENSIVE METABOLIC PANEL
ALT: 18 IU/L (ref 0–32)
AST: 24 IU/L (ref 0–40)
Albumin/Globulin Ratio: 1.4 (ref 1.2–2.2)
Albumin: 4.5 g/dL (ref 3.6–4.6)
Alkaline Phosphatase: 79 IU/L (ref 44–121)
BUN/Creatinine Ratio: 16 (ref 12–28)
BUN: 15 mg/dL (ref 8–27)
Bilirubin Total: 0.4 mg/dL (ref 0.0–1.2)
CO2: 26 mmol/L (ref 20–29)
Calcium: 10 mg/dL (ref 8.7–10.3)
Chloride: 101 mmol/L (ref 96–106)
Creatinine, Ser: 0.96 mg/dL (ref 0.57–1.00)
Globulin, Total: 3.2 g/dL (ref 1.5–4.5)
Glucose: 96 mg/dL (ref 70–99)
Potassium: 4.9 mmol/L (ref 3.5–5.2)
Sodium: 143 mmol/L (ref 134–144)
Total Protein: 7.7 g/dL (ref 6.0–8.5)
eGFR: 59 mL/min/{1.73_m2} — ABNORMAL LOW (ref >59)

## 2021-12-07 LAB — LIPID PANEL
Chol/HDL Ratio: 3 ratio (ref 0.0–4.4)
Cholesterol, Total: 169 mg/dL (ref 100–199)
HDL: 57 mg/dL (ref >39)
LDL Chol Calc (NIH): 95 mg/dL (ref 0–99)
Triglycerides: 91 mg/dL (ref 0–149)
VLDL Cholesterol Cal: 17 mg/dL (ref 5–40)

## 2021-12-07 LAB — CBC WITH DIFFERENTIAL/PLATELET
Basophils Absolute: 0.1 10*3/uL (ref 0.0–0.2)
Basos: 1 %
EOS (ABSOLUTE): 0.2 10*3/uL (ref 0.0–0.4)
Eos: 3 %
Hematocrit: 46.1 % (ref 34.0–46.6)
Hemoglobin: 15.2 g/dL (ref 11.1–15.9)
Immature Grans (Abs): 0 10*3/uL (ref 0.0–0.1)
Immature Granulocytes: 0 %
Lymphocytes Absolute: 3.6 10*3/uL — ABNORMAL HIGH (ref 0.7–3.1)
Lymphs: 54 %
MCH: 30.2 pg (ref 26.6–33.0)
MCHC: 33 g/dL (ref 31.5–35.7)
MCV: 92 fL (ref 79–97)
Monocytes Absolute: 0.5 10*3/uL (ref 0.1–0.9)
Monocytes: 8 %
Neutrophils Absolute: 2.3 10*3/uL (ref 1.4–7.0)
Neutrophils: 34 %
Platelets: 218 10*3/uL (ref 150–450)
RBC: 5.03 x10E6/uL (ref 3.77–5.28)
RDW: 12.1 % (ref 11.7–15.4)
WBC: 6.7 10*3/uL (ref 3.4–10.8)

## 2021-12-07 LAB — TSH: TSH: 2.59 u[IU]/mL (ref 0.450–4.500)

## 2021-12-13 ENCOUNTER — Ambulatory Visit (INDEPENDENT_AMBULATORY_CARE_PROVIDER_SITE_OTHER): Payer: PPO

## 2021-12-13 DIAGNOSIS — H6123 Impacted cerumen, bilateral: Secondary | ICD-10-CM

## 2021-12-13 NOTE — Progress Notes (Signed)
Patient presented to the office for bilateral cerumen impaction. Patient was seen by Dr. Neomia Dear last week and advised to return today for ear cleaning.   Patient tolerated the procedure well. Cerumen removed from both ears with both ear drums visible after removal.

## 2021-12-23 ENCOUNTER — Ambulatory Visit: Payer: PPO

## 2022-01-16 NOTE — Progress Notes (Unsigned)
Cardiology Office Note  Date:  01/17/2022   ID:  Brianna Stevens, DOB 1941/05/19, MRN [id number]  PCP:  Charlynne Cousins, MD   Chief Complaint  Patient presents with   New Patient (Initial Visit)    Ref by Dr. Neomia Dear for palpitations, Hypercholesteremia and heart murmur. Patient c/o shortness of breath with little exertion and occasional feels fluttering in chest.  Medications reviewed by the patient verbally.      HPI:  [name] is a 81 yo woman with PMH of  Hyperlipidemia Referred by Dr. Neomia Dear for palpitations, hyperlipidemia, murmur  Long discussions today she reports that she has Rare palpitations when laying in bed at night, Lasts seconds, goes away without intervention Happens when overdoing it, "lots of work" Very infrequent, does not bother her very much Tries to avoid stimulants  Lab work reviewed Total chol 169 LDL 95  Prior diagnosis of murmur Echo in 2021 Normal EF, aortic valve sclerosis without stenosis  Denies SOB, very active at baseline Does push mowing, denies shortness of breath Rare shortness of breath with select activities such as making the bed  EKG personally reviewed by myself on todays visit Normal sinus rhythm rate 64 bpm no significant ST or T wave changes  PMH:   has a past medical history of GERD (gastroesophageal reflux disease), adenomatous colonic polyps, and Hyperlipidemia.  PSH:    Past Surgical History:  Procedure Laterality Date   APPENDECTOMY     BREAST CYST ASPIRATION Bilateral    CHOLECYSTECTOMY     COLONOSCOPY     COLONOSCOPY WITH PROPOFOL N/A 09/10/2019   Procedure: COLONOSCOPY WITH PROPOFOL;  Surgeon: Toledo, Benay Pike, MD;  Location: [facility];  Service: Gastroenterology;  Laterality: N/A;   ESOPHAGOGASTRODUODENOSCOPY     FLEXIBLE SIGMOIDOSCOPY     VARICOSE VEIN SURGERY Bilateral    done [facility]    Current Outpatient Medications  Medication Sig Dispense Refill   aspirin EC 81 MG tablet Take  81 mg by mouth daily.     calcium-vitamin D 250-100 MG-UNIT per tablet Take 1 tablet by mouth 2 (two) times daily.     carbamide peroxide (DEBROX) 6.5 % OTIC solution Place 5 drops into both ears 2 (two) times daily. 15 mL 0   losartan (COZAAR) 25 MG tablet Take 1 tablet (25 mg total) by mouth daily. 30 tablet 3   meclizine (ANTIVERT) 25 MG tablet Take 1 tablet (25 mg total) by mouth 3 (three) times daily as needed. 90 tablet 3   Multiple Vitamin (MULTI-VITAMINS) TABS Take by mouth.     mupirocin cream (BACTROBAN) 2 % Apply 1 application topically 2 (two) times daily. 15 g 0   omeprazole (PRILOSEC) 20 MG capsule Take 1 capsule (20 mg total) by mouth 2 (two) times daily. 180 capsule 1   simvastatin (ZOCOR) 20 MG tablet TAKE 1 TABLET (20 MG TOTAL) BY MOUTH DAILY. PLEASE CALL FOR AN APPOINTMENT FOR MORE REFILLS 90 tablet 0   No current facility-administered medications for this visit.     Allergies:   Pantoprazole   Social History:  The patient  reports that she has never smoked. She has never used smokeless tobacco. She reports current alcohol use of about 1.0 standard drink of alcohol per week. She reports that she does not use drugs.   Family History:   family history includes Prostate cancer (age of onset: 21) in her brother and brother.    Review of Systems: Review of Systems  Constitutional: Negative.   HENT: Negative.    Respiratory: Negative.    Cardiovascular: Negative.   Gastrointestinal: Negative.   Musculoskeletal: Negative.   Neurological: Negative.   Psychiatric/Behavioral: Negative.    All other systems reviewed and are negative.   PHYSICAL EXAM: VS:  BP 136/80 (BP Location: Right Arm, Patient Position: Sitting, Cuff Size: Normal)   Pulse 64   Ht '5\' 3"'$  (1.6 m)   Wt 146 lb 8 oz (66.5 kg)   SpO2 99%   BMI 25.95 kg/m  , BMI Body mass index is 25.95 kg/m. GEN: Well nourished, well developed, in no acute distress HEENT: normal Neck: no JVD, carotid bruits, or  masses Cardiac: RRR; 1/6 systolic ejection murmur right sternal border No rubs, or gallops,no edema  Respiratory:  clear to auscultation bilaterally, normal work of breathing GI: soft, nontender, nondistended, + BS MS: no deformity or atrophy Skin: warm and dry, no rash Neuro:  Strength and sensation are intact Psych: euthymic mood, full affect   Recent Labs: 12/06/2021: ALT 18; BUN 15; Creatinine, Ser 0.96; Hemoglobin 15.2; Platelets 218; Potassium 4.9; Sodium 143; TSH 2.590    Lipid Panel Lab Results  Component Value Date   CHOL 169 12/06/2021   HDL 57 12/06/2021   LDLCALC 95 12/06/2021   TRIG 91 12/06/2021      Wt Readings from Last 3 Encounters:  01/17/22 146 lb 8 oz (66.5 kg)  12/06/21 145 lb 12.8 oz (66.1 kg)  12/14/20 150 lb 8 oz (68.3 kg)      ASSESSMENT AND PLAN:  Problem List Items Addressed This Visit       Cardiology Problems   Hypercholesteremia - Primary   Relevant Orders   EKG 12-Lead   Other Visit Diagnoses     Aortic valve sclerosis       Palpitations          Palpitations Likely having rare PACs/PVCs Not bothered by them, presenting very sporadically, short-lived several seconds Likely benign finding She does not feel that she needs further work-up If symptoms get worse could order a Zio monitor to quantify Beta-blocker if more symptomatic Types of arrhythmia discussed with her Less likely atrial fibrillation or other arrhythmia  Aortic valve sclerosis/murmur Noted on echocardiogram March 2021, minimal sclerosis, low-grade murmur, no further work-up needed at this time  Hyperlipidemia Numbers relatively well controlled, no further work-up needed On simvastatin     Total encounter time more than 50 minutes  Greater than 50% was spent in counseling and coordination of care with the patient    Signed, Esmond Plants, M.D., Ph.D. [place], [place]

## 2022-01-17 ENCOUNTER — Ambulatory Visit (INDEPENDENT_AMBULATORY_CARE_PROVIDER_SITE_OTHER): Payer: PPO | Admitting: Cardiovascular Disease

## 2022-01-17 ENCOUNTER — Encounter: Payer: Self-pay | Admitting: Cardiovascular Disease

## 2022-01-17 VITALS — BP 136/80 | HR 64 | Ht 63.0 in | Wt 146.5 lb

## 2022-01-17 DIAGNOSIS — R002 Palpitations: Secondary | ICD-10-CM

## 2022-01-17 DIAGNOSIS — E7800 Pure hypercholesterolemia, unspecified: Secondary | ICD-10-CM

## 2022-01-17 DIAGNOSIS — I358 Other nonrheumatic aortic valve disorders: Secondary | ICD-10-CM

## 2022-01-17 NOTE — Patient Instructions (Signed)
Medication Instructions:  No changes  If you need a refill on your cardiac medications before your next appointment, please call your pharmacy.   Lab work: No new labs needed  Testing/Procedures: No new testing needed  Follow-Up: At [facility], you and your health needs are our priority.  As part of our continuing mission to provide you with exceptional heart care, we have created designated Provider Care Teams.  These Care Teams include your primary Cardiologist (physician) and Advanced Practice Providers (APPs -  Physician Assistants and Nurse Practitioners) who all work together to provide you with the care you need, when you need it.  You will need a follow up appointment as needed  Providers on your designated Care Team:   Christopher Berge, NP Ryan Dunn, PA-C Cadence Furth, PA-C  COVID-19 Vaccine Information can be found at: [email or web address] For questions related to vaccine distribution or appointments, please email [email or web address] or call [phone].    

## 2022-03-01 ENCOUNTER — Other Ambulatory Visit: Payer: Self-pay

## 2022-03-01 MED ORDER — OMEPRAZOLE 20 MG PO CPDR: 20.0000 mg | DELAYED_RELEASE_CAPSULE | Freq: Two times a day (BID) | ORAL | 1 refills | Status: DC

## 2022-03-01 NOTE — Telephone Encounter (Signed)
LOV  12/06/21  Future visit 06/13/22

## 2022-03-02 ENCOUNTER — Other Ambulatory Visit: Payer: Self-pay

## 2022-03-02 DIAGNOSIS — E7800 Pure hypercholesterolemia, unspecified: Secondary | ICD-10-CM

## 2022-03-02 MED ORDER — SIMVASTATIN 20 MG PO TABS: ORAL_TABLET | ORAL | 0 refills | Status: DC

## 2022-03-02 NOTE — Telephone Encounter (Signed)
LOV 12/06/21  Future appt 06/13/22  Last lipid panel 12/06/21

## 2022-03-07 ENCOUNTER — Other Ambulatory Visit: Payer: Self-pay

## 2022-03-07 DIAGNOSIS — E7800 Pure hypercholesterolemia, unspecified: Secondary | ICD-10-CM

## 2022-03-07 DIAGNOSIS — E785 Hyperlipidemia, unspecified: Secondary | ICD-10-CM

## 2022-03-07 DIAGNOSIS — R002 Palpitations: Secondary | ICD-10-CM

## 2022-03-07 DIAGNOSIS — H6123 Impacted cerumen, bilateral: Secondary | ICD-10-CM

## 2022-03-07 MED ORDER — LOSARTAN POTASSIUM 25 MG PO TABS: 25.0000 mg | ORAL_TABLET | Freq: Every day | ORAL | 1 refills | Status: DC

## 2022-03-07 NOTE — Telephone Encounter (Signed)
LOV 12/06/21  Future visit 06/13/22

## 2022-05-24 DIAGNOSIS — Z124 Encounter for screening for malignant neoplasm of cervix: Secondary | ICD-10-CM | POA: Diagnosis not present

## 2022-05-24 DIAGNOSIS — Z1211 Encounter for screening for malignant neoplasm of colon: Secondary | ICD-10-CM | POA: Diagnosis not present

## 2022-05-24 DIAGNOSIS — Z1231 Encounter for screening mammogram for malignant neoplasm of breast: Secondary | ICD-10-CM | POA: Diagnosis not present

## 2022-05-29 ENCOUNTER — Other Ambulatory Visit: Payer: Self-pay | Admitting: Obstetrics and Gynecology

## 2022-05-29 DIAGNOSIS — Z1231 Encounter for screening mammogram for malignant neoplasm of breast: Secondary | ICD-10-CM

## 2022-05-30 ENCOUNTER — Other Ambulatory Visit: Payer: Self-pay | Admitting: Nurse Practitioner

## 2022-05-30 DIAGNOSIS — E7800 Pure hypercholesterolemia, unspecified: Secondary | ICD-10-CM

## 2022-05-30 NOTE — Telephone Encounter (Signed)
Requested Prescriptions  Pending Prescriptions Disp Refills   simvastatin (ZOCOR) 20 MG tablet [Pharmacy Med Name: SIMVASTATIN 20 MG TABLET] 90 tablet 0    Sig: TAKE 1 TABLET BY MOUTH EVERY DAY     Cardiovascular:  Antilipid - Statins Failed - 05/30/2022  1:55 AM      Failed - Lipid Panel in normal range within the last 12 months    Cholesterol, Total  Date Value Ref Range Status  12/06/2021 169 100 - 199 mg/dL Final   Cholesterol Piccolo, Waived  Date Value Ref Range Status  12/17/2017 172 <200 mg/dL Final    Comment:                            Desirable                <200                         Borderline High      200- 239                         High                     >239    LDL Chol Calc (NIH)  Date Value Ref Range Status  12/06/2021 95 0 - 99 mg/dL Final   HDL  Date Value Ref Range Status  12/06/2021 57 >39 mg/dL Final   Triglycerides  Date Value Ref Range Status  12/06/2021 91 0 - 149 mg/dL Final   Triglycerides Piccolo,Waived  Date Value Ref Range Status  12/17/2017 72 <150 mg/dL Final    Comment:                            Normal                   <150                         Borderline High     150 - 199                         High                200 - 499                         Very High                >499          Passed - Patient is not pregnant      Passed - Valid encounter within last 12 months    Recent Outpatient Visits           5 months ago Palpitation   [facility] Vigg, Avanti, MD   1 year ago Suspected COVID-19 virus infection   [facility] Vigg, Avanti, MD   1 year ago Screening for osteoporosis   [facility] Vigg, Avanti, MD   1 year ago Reflux gastritis   [place], [place], DO   2 years ago [place], Barbaraann Faster, NP       Future Appointments  In 2 weeks Mecum, Dani Gobble, PA-C [employer], [facility]    In 2 months Nehemiah Massed, Monia Sabal, MD [place]

## 2022-06-07 DIAGNOSIS — Z1211 Encounter for screening for malignant neoplasm of colon: Secondary | ICD-10-CM | POA: Diagnosis not present

## 2022-06-13 ENCOUNTER — Encounter: Payer: Self-pay | Admitting: Family Medicine

## 2022-06-13 ENCOUNTER — Ambulatory Visit (INDEPENDENT_AMBULATORY_CARE_PROVIDER_SITE_OTHER): Payer: PPO | Admitting: Family Medicine

## 2022-06-13 ENCOUNTER — Ambulatory Visit: Payer: PPO | Admitting: Physician Assistant

## 2022-06-13 VITALS — BP 134/76 | HR 58 | Temp 98.0°F | Ht 63.0 in | Wt 145.8 lb

## 2022-06-13 DIAGNOSIS — E7800 Pure hypercholesterolemia, unspecified: Secondary | ICD-10-CM

## 2022-06-13 DIAGNOSIS — K2960 Other gastritis without bleeding: Secondary | ICD-10-CM | POA: Diagnosis not present

## 2022-06-13 DIAGNOSIS — Z0000 Encounter for general adult medical examination without abnormal findings: Secondary | ICD-10-CM | POA: Diagnosis not present

## 2022-06-13 DIAGNOSIS — I10 Essential (primary) hypertension: Secondary | ICD-10-CM | POA: Diagnosis not present

## 2022-06-13 DIAGNOSIS — H6123 Impacted cerumen, bilateral: Secondary | ICD-10-CM

## 2022-06-13 MED ORDER — MECLIZINE HCL 25 MG PO TABS: 25.0000 mg | ORAL_TABLET | Freq: Three times a day (TID) | ORAL | 3 refills | Status: AC | PRN

## 2022-06-13 MED ORDER — SIMVASTATIN 20 MG PO TABS: ORAL_TABLET | ORAL | 0 refills | Status: DC

## 2022-06-13 MED ORDER — OMEPRAZOLE 20 MG PO CPDR: 20.0000 mg | DELAYED_RELEASE_CAPSULE | Freq: Two times a day (BID) | ORAL | 0 refills | Status: DC

## 2022-06-13 MED ORDER — LOSARTAN POTASSIUM 25 MG PO TABS: 25.0000 mg | ORAL_TABLET | Freq: Every day | ORAL | 0 refills | Status: DC

## 2022-06-13 NOTE — Assessment & Plan Note (Signed)
Under good control on current regimen. Continue current regimen. Continue to monitor. Call with any concerns. Refills given. Labs drawn today.   

## 2022-06-13 NOTE — Progress Notes (Signed)
BP 134/76   Pulse (!) 58   Temp 98 F (36.7 C) (Oral)   Ht _0  (1.6 m)   Wt 145 lb 12.8 oz (66.1 kg)   SpO2 96%   BMI 25.83 kg/m    Subjective:    Patient ID: Brianna Stevens, female    DOB: 1941/05/25, 81 y.o.   MRN: [id number]  HPI: KHADEJAH SON is a 81 y.o. female presenting on 06/13/2022 for comprehensive medical examination. Current medical complaints include:  HYPERTENSION / HYPERLIPIDEMIA Satisfied with current treatment? yes Duration of hypertension: chronic BP monitoring frequency: not checking BP medication side effects: no Past BP meds: losartan Duration of hyperlipidemia: chronic Cholesterol medication side effects: no Cholesterol supplements: none Past cholesterol medications: simvastatin Medication compliance: excellent compliance Aspirin: yes Recent stressors: no Recurrent headaches: no Visual changes: no Palpitations: no Dyspnea: no Chest pain: no Lower extremity edema: no Dizzy/lightheaded: no  Menopausal Symptoms: no  Functional Status Survey: Is the patient deaf or have difficulty hearing?: No Does the patient have difficulty seeing, even when wearing glasses/contacts?: No Does the patient have difficulty concentrating, remembering, or making decisions?: No Does the patient have difficulty walking or climbing stairs?: No Does the patient have difficulty dressing or bathing?: No Does the patient have difficulty doing errands alone such as visiting a doctor's office or shopping?: No     06/13/2022    2:47 PM 12/06/2021   10:19 AM 06/06/2021    9:12 AM 01/11/2021    9:48 AM 12/14/2020    8:50 AM  [place] in the past year? 0 0 0 1 0  Number falls in past yr: 0 0 0 0 0  Injury with Fall? 0 0 0 1 0  Risk for fall due to : No Fall Risks No Fall Risks  Other (Comment) No Fall Risks  Follow up Falls evaluation completed Falls evaluation completed Falls evaluation completed;Falls prevention discussed Falls evaluation completed Falls  evaluation completed    Depression Screen    06/13/2022    2:47 PM 12/06/2021   10:19 AM 06/06/2021    9:22 AM 01/11/2021    9:49 AM 12/14/2020    8:50 AM  Depression screen PHQ 2/9  Decreased Interest 0 0 0 0 0  Down, Depressed, Hopeless 0 0 0 0 0  PHQ - 2 Score 0 0 0 0 0  Altered sleeping 1 1  0 0  Tired, decreased energy 0 0  0 0  Change in appetite 0 0  0 0  Feeling bad or failure about yourself  0 0  0 0  Trouble concentrating 0 0  0 0  Moving slowly or fidgety/restless 0 0  0 0  Suicidal thoughts 0 0  0 0  PHQ-9 Score 1 1  0 0  Difficult doing work/chores Not difficult at all Not difficult at all   Not difficult at all    Advanced Directives Does patient have a HCPOA?    no Does patient have a living will or MOST form?  no  Past Medical History:  Past Medical History:  Diagnosis Date   GERD (gastroesophageal reflux disease)    Hx of adenomatous colonic polyps    Hyperlipidemia     Surgical History:  Past Surgical History:  Procedure Laterality Date   APPENDECTOMY     BREAST CYST ASPIRATION Bilateral    CHOLECYSTECTOMY     COLONOSCOPY     COLONOSCOPY WITH PROPOFOL N/A 09/10/2019   Procedure:  COLONOSCOPY WITH PROPOFOL;  Surgeon: Toledo, Benay Pike, MD;  Location: [facility] ENDOSCOPY;  Service: Gastroenterology;  Laterality: N/A;   ESOPHAGOGASTRODUODENOSCOPY     FLEXIBLE SIGMOIDOSCOPY     VARICOSE VEIN SURGERY Bilateral    done at [facility]    Medications:  Current Outpatient Medications on File Prior to Visit  Medication Sig   aspirin EC 81 MG tablet Take 81 mg by mouth daily.   calcium-vitamin D 250-100 MG-UNIT per tablet Take 1 tablet by mouth 2 (two) times daily.   carbamide peroxide (DEBROX) 6.5 % OTIC solution Place 5 drops into both ears 2 (two) times daily.   Multiple Vitamin (MULTI-VITAMINS) TABS Take by mouth.   No current facility-administered medications on file prior to visit.    Allergies:  Allergies  Allergen Reactions    Pantoprazole Palpitations    Social History:  Social History   Socioeconomic History   Marital status: Widowed    Spouse name: Not on file   Number of children: Not on file   Years of education: 13   Highest education level: Some college, no degree  Occupational History   Occupation: retired  Tobacco Use   Smoking status: Never   Smokeless tobacco: Never  Vaping Use   Vaping Use: Never used  Substance and Sexual Activity   Alcohol use: Yes    Alcohol/week: 1.0 standard drink of alcohol    Types: 1 Glasses of wine per week    Comment: social   Drug use: No   Sexual activity: Not Currently  Other Topics Concern   Not on file  Social History Narrative   Senior citizens activities    Social Determinants of Health   Financial Resource Strain: Low Risk  (06/06/2021)   Overall Financial Resource Strain (CARDIA)    Difficulty of Paying Living Expenses: Not hard at all  Food Insecurity: No Food Insecurity (06/06/2021)   Hunger Vital Sign    Worried About Running Out of Food in the Last Year: Never true    Ran Out of Food in the Last Year: Never true  Transportation Needs: No Transportation Needs (06/06/2021)   PRAPARE - Hydrologist (Medical): No    Lack of Transportation (Non-Medical): No  Physical Activity: Sufficiently Active (06/06/2021)   Exercise Vital Sign    Days of Exercise per Week: 6 days    Minutes of Exercise per Session: 50 min  Stress: No Stress Concern Present (06/06/2021)   [place]    Feeling of Stress : Not at all  Social Connections: Moderately Isolated (06/06/2021)   Social Connection and Isolation Panel [NHANES]    Frequency of Communication with Friends and Family: More than three times a week    Frequency of Social Gatherings with Friends and Family: Three times a week    Attends Religious Services: More than 4 times per year    Active Member of Clubs or  Organizations: No    Attends Archivist Meetings: Never    Marital Status: Widowed  Intimate Partner Violence: Not At Risk (06/06/2021)   Humiliation, Afraid, Rape, and Kick questionnaire    Fear of Current or Ex-Partner: No    Emotionally Abused: No    Physically Abused: No    Sexually Abused: No   Social History   Tobacco Use  Smoking Status Never  Smokeless Tobacco Never   Social History   Substance and Sexual Activity  Alcohol  Use Yes   Alcohol/week: 1.0 standard drink of alcohol   Types: 1 Glasses of wine per week   Comment: social    Family History:  Family History  Problem Relation Age of Onset   Prostate cancer Brother 61   Prostate cancer Brother 56   Breast cancer Neg Hx     Past medical history, surgical history, medications, allergies, family history and social history reviewed with patient today and changes made to appropriate areas of the chart.   Review of Systems  Constitutional: Negative.   HENT:  Positive for ear pain (R ear). Negative for congestion, ear discharge, hearing loss, nosebleeds, sinus pain, sore throat and tinnitus.   Eyes: Negative.   Respiratory: Negative.  Negative for stridor.   Cardiovascular: Negative.   Gastrointestinal: Negative.   Genitourinary: Negative.   Musculoskeletal: Negative.   Skin: Negative.   Neurological: Negative.   Endo/Heme/Allergies:  Positive for environmental allergies. Negative for polydipsia. Does not bruise/bleed easily.  Psychiatric/Behavioral: Negative.      All other ROS negative except what is listed above and in the HPI.      Objective:    BP 134/76   Pulse (!) 58   Temp 98 F (36.7 C) (Oral)   Ht _0  (1.6 m)   Wt 145 lb 12.8 oz (66.1 kg)   SpO2 96%   BMI 25.83 kg/m   Wt Readings from Last 3 Encounters:  06/13/22 145 lb 12.8 oz (66.1 kg)  01/17/22 146 lb 8 oz (66.5 kg)  12/06/21 145 lb 12.8 oz (66.1 kg)     Physical Exam Vitals and nursing note reviewed.   Constitutional:      General: She is not in acute distress.    Appearance: Normal appearance. She is normal weight. She is not ill-appearing, toxic-appearing or diaphoretic.  HENT:     Head: Normocephalic and atraumatic.     Right Ear: Ear canal and external ear normal. There is impacted cerumen.     Left Ear: Tympanic membrane, ear canal and external ear normal. There is no impacted cerumen.     Nose: Nose normal. No congestion or rhinorrhea.     Mouth/Throat:     Mouth: Mucous membranes are moist.     Pharynx: Oropharynx is clear. No oropharyngeal exudate or posterior oropharyngeal erythema.  Eyes:     General: No scleral icterus.       Right eye: No discharge.        Left eye: No discharge.     Extraocular Movements: Extraocular movements intact.     Conjunctiva/sclera: Conjunctivae normal.     Pupils: Pupils are equal, round, and reactive to light.  Neck:     Vascular: No carotid bruit.  Cardiovascular:     Rate and Rhythm: Normal rate and regular rhythm.     Pulses: Normal pulses.     Heart sounds: No murmur heard.    No friction rub. No gallop.  Pulmonary:     Effort: Pulmonary effort is normal. No respiratory distress.     Breath sounds: Normal breath sounds. No stridor. No wheezing, rhonchi or rales.  Chest:     Chest wall: No tenderness.  Abdominal:     General: Abdomen is flat. Bowel sounds are normal. There is no distension.     Palpations: Abdomen is soft. There is no mass.     Tenderness: There is no abdominal tenderness. There is no right CVA tenderness, left CVA tenderness, guarding or rebound.  Hernia: No hernia is present.  Genitourinary:    Comments: Breast and pelvic exams deferred with shared decision making Musculoskeletal:        General: No swelling, tenderness, deformity or signs of injury.     Cervical back: Normal range of motion and neck supple. No rigidity. No muscular tenderness.     Right lower leg: No edema.     Left lower leg: No edema.   Lymphadenopathy:     Cervical: No cervical adenopathy.  Skin:    General: Skin is warm and dry.     Capillary Refill: Capillary refill takes less than 2 seconds.     Coloration: Skin is not jaundiced or pale.     Findings: No bruising, erythema, lesion or rash.  Neurological:     General: No focal deficit present.     Mental Status: She is alert and oriented to person, place, and time. Mental status is at baseline.     Cranial Nerves: No cranial nerve deficit.     Sensory: No sensory deficit.     Motor: No weakness.     Coordination: Coordination normal.     Gait: Gait normal.     Deep Tendon Reflexes: Reflexes normal.  Psychiatric:        Mood and Affect: Mood normal.        Behavior: Behavior normal.        Thought Content: Thought content normal.        Judgment: Judgment normal.        06/13/2022    3:05 PM 12/25/2018    9:39 AM 12/17/2017    9:51 AM 11/29/2016   10:43 AM  6CIT Screen  What Year? 0 points 0 points 0 points 0 points  What month? 0 points 0 points 0 points 0 points  What time? 0 points 0 points 0 points 0 points  Count back from 20 0 points 0 points 0 points 0 points  Months in reverse 0 points 0 points 0 points 2 points  Repeat phrase 4 points 0 points 4 points 2 points  Total Score 4 points 0 points 4 points 4 points    Results for orders placed or performed in visit on 12/06/21  Microscopic Examination   Urine  Result Value Ref Range   WBC, UA 0-5 0 - 5 /hpf   RBC, Urine 0-2 0 - 2 /hpf   Epithelial Cells (non renal) None seen 0 - 10 /hpf   Bacteria, UA None seen None seen/Few  TSH  Result Value Ref Range   TSH 2.590 0.450 - 4.500 uIU/mL  Lipid panel  Result Value Ref Range   Cholesterol, Total 169 100 - 199 mg/dL   Triglycerides 91 0 - 149 mg/dL   HDL 57 >39 mg/dL   VLDL Cholesterol Cal 17 5 - 40 mg/dL   LDL Chol Calc (NIH) 95 0 - 99 mg/dL   Chol/HDL Ratio 3.0 0.0 - 4.4 ratio  CBC with Differential/Platelet  Result Value Ref Range    WBC 6.7 3.4 - 10.8 x10E3/uL   RBC 5.03 3.77 - 5.28 x10E6/uL   Hemoglobin 15.2 11.1 - 15.9 g/dL   Hematocrit 46.1 34.0 - 46.6 %   MCV 92 79 - 97 fL   MCH 30.2 26.6 - 33.0 pg   MCHC 33.0 31.5 - 35.7 g/dL   RDW 12.1 11.7 - 15.4 %   Platelets 218 150 - 450 x10E3/uL   Neutrophils 34 Not Estab. %   Lymphs 54  Not Estab. %   Monocytes 8 Not Estab. %   Eos 3 Not Estab. %   Basos 1 Not Estab. %   Neutrophils Absolute 2.3 1.4 - 7.0 x10E3/uL   Lymphocytes Absolute 3.6 (H) 0.7 - 3.1 x10E3/uL   Monocytes Absolute 0.5 0.1 - 0.9 x10E3/uL   EOS (ABSOLUTE) 0.2 0.0 - 0.4 x10E3/uL   Basophils Absolute 0.1 0.0 - 0.2 x10E3/uL   Immature Granulocytes 0 Not Estab. %   Immature Grans (Abs) 0.0 0.0 - 0.1 x10E3/uL  Comprehensive metabolic panel  Result Value Ref Range   Glucose 96 70 - 99 mg/dL   BUN 15 8 - 27 mg/dL   Creatinine, Ser 0.96 0.57 - 1.00 mg/dL   eGFR 59 (L) >59 mL/min/1.73   BUN/Creatinine Ratio 16 12 - 28   Sodium 143 134 - 144 mmol/L   Potassium 4.9 3.5 - 5.2 mmol/L   Chloride 101 96 - 106 mmol/L   CO2 26 20 - 29 mmol/L   Calcium 10.0 8.7 - 10.3 mg/dL   Total Protein 7.7 6.0 - 8.5 g/dL   Albumin 4.5 3.6 - 4.6 g/dL   Globulin, Total 3.2 1.5 - 4.5 g/dL   Albumin/Globulin Ratio 1.4 1.2 - 2.2   Bilirubin Total 0.4 0.0 - 1.2 mg/dL   Alkaline Phosphatase 79 44 - 121 IU/L   AST 24 0 - 40 IU/L   ALT 18 0 - 32 IU/L  Urinalysis, Routine w reflex microscopic  Result Value Ref Range   Specific Gravity, UA 1.015 1.005 - 1.030   pH, UA 7.0 5.0 - 7.5   Color, UA Yellow Yellow   Appearance Ur Clear Clear   Leukocytes,UA 2+ (A) Negative   Protein,UA Negative Negative/Trace   Glucose, UA Negative Negative   Ketones, UA Negative Negative   RBC, UA Trace (A) Negative   Bilirubin, UA Negative Negative   Urobilinogen, Ur 1.0 0.2 - 1.0 mg/dL   Nitrite, UA Negative Negative   Microscopic Examination See below:       Assessment & Plan:   Problem List Items Addressed This Visit        Cardiovascular and Mediastinum   HTN (hypertension)    Under good control on current regimen. Continue current regimen. Continue to monitor. Call with any concerns. Refills given. Labs drawn today.        Relevant Medications   losartan (COZAAR) 25 MG tablet   simvastatin (ZOCOR) 20 MG tablet   Other Relevant Orders   Comprehensive metabolic panel   Urinalysis, Routine w reflex microscopic   TSH   Urine Microalbumin w/creat. ratio     Digestive   Reflux gastritis    Under good control on current regimen. Continue current regimen. Continue to monitor. Call with any concerns. Refills given. Labs drawn today.       Relevant Orders   CBC with Differential/Platelet     Nervous and Auditory   Bilateral hearing loss due to cerumen impaction   Relevant Medications   losartan (COZAAR) 25 MG tablet     Other   Hypercholesteremia    Under good control on current regimen. Continue current regimen. Continue to monitor. Call with any concerns. Refills given. Labs drawn today.       Relevant Medications   losartan (COZAAR) 25 MG tablet   simvastatin (ZOCOR) 20 MG tablet   Other Relevant Orders   Comprehensive metabolic panel   Lipid Panel w/o Chol/HDL Ratio   Other Visit Diagnoses     Encounter  for Medicare annual wellness exam    -  Primary   Preventative care discussed today as below.   Routine general medical examination at a health care facility       Vaccines up to date. Screening labs checked today. DEXA up to date. Continue diet and exercise. Call with any concerns.        Preventative Services:  Health Risk Assessment and Personalized Prevention Plan: Done today Bone Mass Measurements: Up to Date Breast Cancer Screening: N/A CVD Screening: Done today Cervical Cancer Screening: N/A Colon Cancer Screening: N/A Depression Screening: Done today Diabetes Screening: Done today Glaucoma Screening: See your eye doctor Hepatitis B vaccine: N/A Hepatitis C screening: Up to  date HIV Screening: Up to date Flu Vaccine: Declined Lung cancer Screening: N/A Obesity Screening: Done today Pneumonia Vaccines (2): Up to date STI Screening: N/A  Follow up plan: Return in about 6 months (around 12/13/2022).   LABORATORY TESTING:  - Pap smear: not applicable  IMMUNIZATIONS:   - Tdap: Tetanus vaccination status reviewed: Refused. - Influenza: Refused - Pneumovax: Up to date - Prevnar: Up to date - Zostavax vaccine: Given elsewhere  SCREENING: -Mammogram: Not applicable  - Colonoscopy: Not applicable  - Bone Density: Up to date   PATIENT COUNSELING:   Advised to take 1 mg of folate supplement per day if capable of pregnancy.   Sexuality: Discussed sexually transmitted diseases, partner selection, use of condoms, avoidance of unintended pregnancy  and contraceptive alternatives.   Advised to avoid cigarette smoking.  I discussed with the patient that most people either abstain from alcohol or drink within safe limits (<=14/week and <=4 drinks/occasion for males, <=7/weeks and <= 3 drinks/occasion for females) and that the risk for alcohol disorders and other health effects rises proportionally with the number of drinks per week and how often a drinker exceeds daily limits.  Discussed cessation/primary prevention of drug use and availability of treatment for abuse.   Diet: Encouraged to adjust caloric intake to maintain  or achieve ideal body weight, to reduce intake of dietary saturated fat and total fat, to limit sodium intake by avoiding high sodium foods and not adding table salt, and to maintain adequate dietary potassium and calcium preferably from fresh fruits, vegetables, and low-fat dairy products.    stressed the importance of regular exercise  Injury prevention: Discussed safety belts, safety helmets, smoke detector, smoking near bedding or upholstery.   Dental health: Discussed importance of regular tooth brushing, flossing, and dental visits.     NEXT PREVENTATIVE PHYSICAL DUE IN 1 YEAR. Return in about 6 months (around 12/13/2022).

## 2022-06-13 NOTE — Patient Instructions (Signed)
Preventative Services:  Health Risk Assessment and Personalized Prevention Plan: Done today Bone Mass Measurements: Up to Date Breast Cancer Screening: N/A CVD Screening: Done today Cervical Cancer Screening: N/A Colon Cancer Screening: N/A Depression Screening: Done today Diabetes Screening: Done today Glaucoma Screening: See your eye doctor Hepatitis B vaccine: N/A Hepatitis C screening: Up to date HIV Screening: Up to date Flu Vaccine: Declined Lung cancer Screening: N/A Obesity Screening: Done today Pneumonia Vaccines (2): Up to date STI Screening: N/A

## 2022-06-14 LAB — CBC WITH DIFFERENTIAL/PLATELET
Basophils Absolute: 0.1 10*3/uL (ref 0.0–0.2)
Basos: 1 %
EOS (ABSOLUTE): 0.1 10*3/uL (ref 0.0–0.4)
Eos: 2 %
Hematocrit: 44 % (ref 34.0–46.6)
Hemoglobin: 14.3 g/dL (ref 11.1–15.9)
Immature Grans (Abs): 0 10*3/uL (ref 0.0–0.1)
Immature Granulocytes: 0 %
Lymphocytes Absolute: 4.5 10*3/uL — ABNORMAL HIGH (ref 0.7–3.1)
Lymphs: 52 %
MCH: 29.9 pg (ref 26.6–33.0)
MCHC: 32.5 g/dL (ref 31.5–35.7)
MCV: 92 fL (ref 79–97)
Monocytes Absolute: 0.6 10*3/uL (ref 0.1–0.9)
Monocytes: 7 %
Neutrophils Absolute: 3.2 10*3/uL (ref 1.4–7.0)
Neutrophils: 38 %
Platelets: 235 10*3/uL (ref 150–450)
RBC: 4.78 x10E6/uL (ref 3.77–5.28)
RDW: 11.9 % (ref 11.7–15.4)
WBC: 8.5 10*3/uL (ref 3.4–10.8)

## 2022-06-14 LAB — LIPID PANEL W/O CHOL/HDL RATIO
Cholesterol, Total: 171 mg/dL (ref 100–199)
HDL: 57 mg/dL (ref >39)
LDL Chol Calc (NIH): 97 mg/dL (ref 0–99)
Triglycerides: 94 mg/dL (ref 0–149)
VLDL Cholesterol Cal: 17 mg/dL (ref 5–40)

## 2022-06-14 LAB — MICROSCOPIC EXAMINATION
Bacteria, UA: NONE SEEN
Casts: NONE SEEN /lpf
RBC, Urine: NONE SEEN /hpf (ref 0–2)

## 2022-06-14 LAB — COMPREHENSIVE METABOLIC PANEL
ALT: 14 IU/L (ref 0–32)
AST: 23 IU/L (ref 0–40)
Albumin/Globulin Ratio: 1.7 (ref 1.2–2.2)
Albumin: 4.6 g/dL (ref 3.7–4.7)
Alkaline Phosphatase: 78 IU/L (ref 44–121)
BUN/Creatinine Ratio: 18 (ref 12–28)
BUN: 17 mg/dL (ref 8–27)
Bilirubin Total: 0.4 mg/dL (ref 0.0–1.2)
CO2: 23 mmol/L (ref 20–29)
Calcium: 9.9 mg/dL (ref 8.7–10.3)
Chloride: 101 mmol/L (ref 96–106)
Creatinine, Ser: 0.94 mg/dL (ref 0.57–1.00)
Globulin, Total: 2.7 g/dL (ref 1.5–4.5)
Glucose: 91 mg/dL (ref 70–99)
Potassium: 5 mmol/L (ref 3.5–5.2)
Sodium: 140 mmol/L (ref 134–144)
Total Protein: 7.3 g/dL (ref 6.0–8.5)
eGFR: 61 mL/min/{1.73_m2} (ref >59)

## 2022-06-14 LAB — URINALYSIS, ROUTINE W REFLEX MICROSCOPIC
Bilirubin, UA: NEGATIVE
Glucose, UA: NEGATIVE
Ketones, UA: NEGATIVE
Nitrite, UA: NEGATIVE
Protein,UA: NEGATIVE
RBC, UA: NEGATIVE
Specific Gravity, UA: 1.009 (ref 1.005–1.030)
Urobilinogen, Ur: 0.2 mg/dL (ref 0.2–1.0)
pH, UA: 6 (ref 5.0–7.5)

## 2022-06-14 LAB — TSH: TSH: 3.08 u[IU]/mL (ref 0.450–4.500)

## 2022-06-14 LAB — MICROALBUMIN / CREATININE URINE RATIO
Creatinine, Urine: 33.7 mg/dL
Microalb/Creat Ratio: 16 mg/g creat (ref 0–29)
Microalbumin, Urine: 5.5 ug/mL

## 2022-08-10 ENCOUNTER — Ambulatory Visit: Payer: PPO | Admitting: Dermatology

## 2022-08-10 VITALS — BP 141/81

## 2022-08-10 DIAGNOSIS — L821 Other seborrheic keratosis: Secondary | ICD-10-CM

## 2022-08-10 DIAGNOSIS — L578 Other skin changes due to chronic exposure to nonionizing radiation: Secondary | ICD-10-CM

## 2022-08-10 DIAGNOSIS — D229 Melanocytic nevi, unspecified: Secondary | ICD-10-CM

## 2022-08-10 DIAGNOSIS — D1801 Hemangioma of skin and subcutaneous tissue: Secondary | ICD-10-CM

## 2022-08-10 DIAGNOSIS — L820 Inflamed seborrheic keratosis: Secondary | ICD-10-CM | POA: Diagnosis not present

## 2022-08-10 DIAGNOSIS — L570 Actinic keratosis: Secondary | ICD-10-CM | POA: Diagnosis not present

## 2022-08-10 DIAGNOSIS — L814 Other melanin hyperpigmentation: Secondary | ICD-10-CM

## 2022-08-10 DIAGNOSIS — Z1283 Encounter for screening for malignant neoplasm of skin: Secondary | ICD-10-CM

## 2022-08-10 NOTE — Progress Notes (Signed)
New Patient Visit  Subjective  Brianna Stevens is a 82 y.o. female who presents for the following: Other (No history of skin cancer or abnormal moles - The patient presents for Total-Body Skin Exam (TBSE) for skin cancer screening and mole check.  The patient has spots, moles and lesions to be evaluated, some may be new or changing and the patient has concerns that these could be cancer./).  The following portions of the chart were reviewed this encounter and updated as appropriate:   Tobacco  Allergies  Meds  Problems  Med Hx  Surg Hx  Fam Hx     Review of Systems:  No other skin or systemic complaints except as noted in HPI or Assessment and Plan.  Objective  Well appearing patient in no apparent distress; mood and affect are within normal limits.  A focused examination was performed including face, trunk, arms, right lower leg. Relevant physical exam findings are noted in the Assessment and Plan.  Left face x 3, right face x 6, right lower leg x 3 (12) Erythematous stuck-on, waxy papule or plaque  Nose x 2, upper lip x 2 (4) Erythematous thin papules/macules with gritty scale.    Assessment & Plan   Lentigines - Scattered tan macules - Due to sun exposure - Benign-appearing, observe - Recommend daily broad spectrum sunscreen SPF 30+ to sun-exposed areas, reapply every 2 hours as needed. - Call for any changes  Seborrheic Keratoses - Stuck-on, waxy, tan-brown papules and/or plaques  - Benign-appearing - Discussed benign etiology and prognosis. - Observe - Call for any changes  Melanocytic Nevi - Tan-brown and/or pink-flesh-colored symmetric macules and papules - Benign appearing on exam today - Observation - Call clinic for new or changing moles - Recommend daily use of broad spectrum spf 30+ sunscreen to sun-exposed areas.   Hemangiomas - Red papules - Discussed benign nature - Observe - Call for any changes  Actinic Damage - Chronic condition,  secondary to cumulative UV/sun exposure - diffuse scaly erythematous macules with underlying dyspigmentation - Recommend daily broad spectrum sunscreen SPF 30+ to sun-exposed areas, reapply every 2 hours as needed.  - Staying in the shade or wearing long sleeves, sun glasses (UVA+UVB protection) and wide brim hats (4-inch brim around the entire circumference of the hat) are also recommended for sun protection.  - Call for new or changing lesions.  Skin cancer screening performed today.  Inflamed seborrheic keratosis (12) Left face x 3, right face x 6, right lower leg x 3  Destruction of lesion - Left face x 3, right face x 6, right lower leg x 3 Complexity: simple   Destruction method: cryotherapy   Informed consent: discussed and consent obtained   Timeout:  patient name, date of birth, surgical site, and procedure verified Lesion destroyed using liquid nitrogen: Yes   Region frozen until ice ball extended beyond lesion: Yes   Outcome: patient tolerated procedure well with no complications   Post-procedure details: wound care instructions given    AK (actinic keratosis) (4) Nose x 2, upper lip x 2  Destruction of lesion - Nose x 2, upper lip x 2 Complexity: simple   Destruction method: cryotherapy   Informed consent: discussed and consent obtained   Timeout:  patient name, date of birth, surgical site, and procedure verified Lesion destroyed using liquid nitrogen: Yes   Region frozen until ice ball extended beyond lesion: Yes   Outcome: patient tolerated procedure well with no complications   Post-procedure  details: wound care instructions given     Return in about 3 months (around 11/08/2022) for AK follow up.  I, Ashok Cordia, CMA, am acting as scribe for Sarina Ser, MD . Documentation: I have reviewed the above documentation for accuracy and completeness, and I agree with the above.  Sarina Ser, MD

## 2022-08-10 NOTE — Patient Instructions (Signed)
Cryotherapy Aftercare  Wash gently with soap and water everyday.   Apply Vaseline and Band-Aid daily until healed.     Due to recent changes in healthcare laws, you may see results of your pathology and/or laboratory studies on MyChart before the doctors have had a chance to review them. We understand that in some cases there may be results that are confusing or concerning to you. Please understand that not all results are received at the same time and often the doctors may need to interpret multiple results in order to provide you with the best plan of care or course of treatment. Therefore, we ask that you please give us 2 business days to thoroughly review all your results before contacting the office for clarification. Should we see a critical lab result, you will be contacted sooner.   If You Need Anything After Your Visit  If you have any questions or concerns for your doctor, please call our main line at [phone] and press option 4 to reach your doctor's medical assistant. If no one answers, please leave a voicemail as directed and we will return your call as soon as possible. Messages left after 4 pm will be answered the following business day.   You may also send us a message via MyChart. We typically respond to MyChart messages within 1-2 business days.  For prescription refills, please ask your pharmacy to contact our office. Our fax number is [phone].  If you have an urgent issue when the clinic is closed that cannot wait until the next business day, you can page your doctor at the number below.    Please note that while we do our best to be available for urgent issues outside of office hours, we are not available 24/7.   If you have an urgent issue and are unable to reach us, you may choose to seek medical care at your doctor's office, retail clinic, urgent care center, or emergency room.  If you have a medical emergency, please immediately call 911 or go to the  emergency department.  Pager Numbers  - Dr. Kowalski: [phone]  - Dr. Moye: [phone]  - Dr. Stewart: [phone]  In the event of inclement weather, please call our main line at [phone] for an update on the status of any delays or closures.  Dermatology Medication Tips: Please keep the boxes that topical medications come in in order to help keep track of the instructions about where and how to use these. Pharmacies typically print the medication instructions only on the boxes and not directly on the medication tubes.   If your medication is too expensive, please contact our office at [phone] option 4 or send us a message through MyChart.   We are unable to tell what your co-pay for medications will be in advance as this is different depending on your insurance coverage. However, we may be able to find a substitute medication at lower cost or fill out paperwork to get insurance to cover a needed medication.   If a prior authorization is required to get your medication covered by your insurance company, please allow us 1-2 business days to complete this process.  Drug prices often vary depending on where the prescription is filled and some pharmacies may offer cheaper prices.  The website www.goodrx.com contains coupons for medications through different pharmacies. The prices here do not account for what the cost may be with help from insurance (it may be cheaper with your insurance), but the website can   give you the price if you did not use any insurance.  - You can print the associated coupon and take it with your prescription to the pharmacy.  - You may also stop by our office during regular business hours and pick up a GoodRx coupon card.  - If you need your prescription sent electronically to a different pharmacy, notify our office through [facility] MyChart or by phone at [phone] option 4.     Si Usted Necesita Algo Despus de Su Visita  Tambin puede  enviarnos un mensaje a travs de MyChart. Por lo general respondemos a los mensajes de MyChart en el transcurso de 1 a 2 das hbiles.  Para renovar recetas, por favor pida a su farmacia que se ponga en contacto con nuestra oficina. Nuestro nmero de fax es el [phone].  Si tiene un asunto urgente cuando la clnica est cerrada y que no puede esperar hasta el siguiente da hbil, puede llamar/localizar a su doctor(a) al nmero que aparece a continuacin.   Por favor, tenga en cuenta que aunque hacemos todo lo posible para estar disponibles para asuntos urgentes fuera del horario de oficina, no estamos disponibles las 24 horas del da, los 7 das de la semana.   Si tiene un problema urgente y no puede comunicarse con nosotros, puede optar por buscar atencin mdica  en el consultorio de su doctor(a), en una clnica privada, en un centro de atencin urgente o en una sala de emergencias.  Si tiene una emergencia mdica, por favor llame inmediatamente al 911 o vaya a la sala de emergencias.  Nmeros de bper  - Dr. Kowalski: [phone]  - Dra. Moye: [phone]  - Dra. Stewart: [phone]  En caso de inclemencias del tiempo, por favor llame a nuestra lnea principal al [phone] para una actualizacin sobre el estado de cualquier retraso o cierre.  Consejos para la medicacin en dermatologa: Por favor, guarde las cajas en las que vienen los medicamentos de uso tpico para ayudarle a seguir las instrucciones sobre dnde y cmo usarlos. Las farmacias generalmente imprimen las instrucciones del medicamento slo en las cajas y no directamente en los tubos del medicamento.   Si su medicamento es muy caro, por favor, pngase en contacto con nuestra oficina llamando al [phone] y presione la opcin 4 o envenos un mensaje a travs de MyChart.   No podemos decirle cul ser su copago por los medicamentos por adelantado ya que esto es diferente dependiendo de la cobertura de su seguro.  Sin embargo, es posible que podamos encontrar un medicamento sustituto a menor costo o llenar un formulario para que el seguro cubra el medicamento que se considera necesario.   Si se requiere una autorizacin previa para que su compaa de seguros cubra su medicamento, por favor permtanos de 1 a 2 das hbiles para completar este proceso.  Los precios de los medicamentos varan con frecuencia dependiendo del lugar de dnde se surte la receta y alguna farmacias pueden ofrecer precios ms baratos.  El sitio web www.goodrx.com tiene cupones para medicamentos de diferentes farmacias. Los precios aqu no tienen en cuenta lo que podra costar con la ayuda del seguro (puede ser ms barato con su seguro), pero el sitio web puede darle el precio si no utiliz ningn seguro.  - Puede imprimir el cupn correspondiente y llevarlo con su receta a la farmacia.  - Tambin puede pasar por nuestra oficina durante el horario de atencin regular y recoger una tarjeta de cupones de GoodRx.  -   Si necesita que su receta se enve electrnicamente a una farmacia diferente, informe a nuestra oficina a travs de MyChart de [facility] o por telfono llamando al [phone] y presione la opcin 4.  

## 2022-08-22 ENCOUNTER — Encounter: Payer: Self-pay | Admitting: Dermatology

## 2022-11-02 ENCOUNTER — Ambulatory Visit
Admission: RE | Admit: 2022-11-02 | Discharge: 2022-11-02 | Disposition: A | Discharge status: Home / Self Care | Payer: PPO | Source: Ambulatory Visit | Attending: Obstetrics and Gynecology | Admitting: Obstetrics and Gynecology

## 2022-11-02 DIAGNOSIS — Z1231 Encounter for screening mammogram for malignant neoplasm of breast: Secondary | ICD-10-CM

## 2022-11-08 ENCOUNTER — Encounter: Payer: Self-pay | Admitting: Dermatology

## 2022-11-08 ENCOUNTER — Ambulatory Visit (INDEPENDENT_AMBULATORY_CARE_PROVIDER_SITE_OTHER): Payer: PPO | Admitting: Dermatology

## 2022-11-08 VITALS — BP 150/85

## 2022-11-08 DIAGNOSIS — W908XXA Exposure to other nonionizing radiation, initial encounter: Secondary | ICD-10-CM | POA: Diagnosis not present

## 2022-11-08 DIAGNOSIS — L820 Inflamed seborrheic keratosis: Secondary | ICD-10-CM | POA: Diagnosis not present

## 2022-11-08 DIAGNOSIS — L570 Actinic keratosis: Secondary | ICD-10-CM | POA: Diagnosis not present

## 2022-11-08 DIAGNOSIS — L821 Other seborrheic keratosis: Secondary | ICD-10-CM

## 2022-11-08 DIAGNOSIS — L578 Other skin changes due to chronic exposure to nonionizing radiation: Secondary | ICD-10-CM | POA: Diagnosis not present

## 2022-11-08 DIAGNOSIS — L738 Other specified follicular disorders: Secondary | ICD-10-CM | POA: Diagnosis not present

## 2022-11-08 DIAGNOSIS — X32XXXA Exposure to sunlight, initial encounter: Secondary | ICD-10-CM | POA: Diagnosis not present

## 2022-11-08 DIAGNOSIS — L814 Other melanin hyperpigmentation: Secondary | ICD-10-CM

## 2022-11-08 NOTE — Patient Instructions (Addendum)
Cryotherapy Aftercare  Wash gently with soap and water everyday.   Apply Vaseline and Band-Aid daily until healed.     Due to recent changes in healthcare laws, you may see results of your pathology and/or laboratory studies on MyChart before the doctors have had a chance to review them. We understand that in some cases there may be results that are confusing or concerning to you. Please understand that not all results are received at the same time and often the doctors may need to interpret multiple results in order to provide you with the best plan of care or course of treatment. Therefore, we ask that you please give us 2 business days to thoroughly review all your results before contacting the office for clarification. Should we see a critical lab result, you will be contacted sooner.   If You Need Anything After Your Visit  If you have any questions or concerns for your doctor, please call our main line at [phone] and press option 4 to reach your doctor's medical assistant. If no one answers, please leave a voicemail as directed and we will return your call as soon as possible. Messages left after 4 pm will be answered the following business day.   You may also send us a message via MyChart. We typically respond to MyChart messages within 1-2 business days.  For prescription refills, please ask your pharmacy to contact our office. Our fax number is [phone].  If you have an urgent issue when the clinic is closed that cannot wait until the next business day, you can page your doctor at the number below.    Please note that while we do our best to be available for urgent issues outside of office hours, we are not available 24/7.   If you have an urgent issue and are unable to reach us, you may choose to seek medical care at your doctor's office, retail clinic, urgent care center, or emergency room.  If you have a medical emergency, please immediately call 911 or go to the  emergency department.  Pager Numbers  - Dr. Kowalski: [phone]  - Dr. Moye: [phone]  - Dr. Stewart: [phone]  In the event of inclement weather, please call our main line at [phone] for an update on the status of any delays or closures.  Dermatology Medication Tips: Please keep the boxes that topical medications come in in order to help keep track of the instructions about where and how to use these. Pharmacies typically print the medication instructions only on the boxes and not directly on the medication tubes.   If your medication is too expensive, please contact our office at [phone] option 4 or send us a message through MyChart.   We are unable to tell what your co-pay for medications will be in advance as this is different depending on your insurance coverage. However, we may be able to find a substitute medication at lower cost or fill out paperwork to get insurance to cover a needed medication.   If a prior authorization is required to get your medication covered by your insurance company, please allow us 1-2 business days to complete this process.  Drug prices often vary depending on where the prescription is filled and some pharmacies may offer cheaper prices.  The website www.goodrx.com contains coupons for medications through different pharmacies. The prices here do not account for what the cost may be with help from insurance (it may be cheaper with your insurance), but the website can   give you the price if you did not use any insurance.  - You can print the associated coupon and take it with your prescription to the pharmacy.  - You may also stop by our office during regular business hours and pick up a GoodRx coupon card.  - If you need your prescription sent electronically to a different pharmacy, notify our office through [facility] MyChart or by phone at [phone] option 4.     Si Usted Necesita Algo Despus de Su Visita  Tambin puede  enviarnos un mensaje a travs de MyChart. Por lo general respondemos a los mensajes de MyChart en el transcurso de 1 a 2 das hbiles.  Para renovar recetas, por favor pida a su farmacia que se ponga en contacto con nuestra oficina. Nuestro nmero de fax es el [phone].  Si tiene un asunto urgente cuando la clnica est cerrada y que no puede esperar hasta el siguiente da hbil, puede llamar/localizar a su doctor(a) al nmero que aparece a continuacin.   Por favor, tenga en cuenta que aunque hacemos todo lo posible para estar disponibles para asuntos urgentes fuera del horario de oficina, no estamos disponibles las 24 horas del da, los 7 das de la semana.   Si tiene un problema urgente y no puede comunicarse con nosotros, puede optar por buscar atencin mdica  en el consultorio de su doctor(a), en una clnica privada, en un centro de atencin urgente o en una sala de emergencias.  Si tiene una emergencia mdica, por favor llame inmediatamente al 911 o vaya a la sala de emergencias.  Nmeros de bper  - Dr. Kowalski: [phone]  - Dra. Moye: [phone]  - Dra. Stewart: [phone]  En caso de inclemencias del tiempo, por favor llame a nuestra lnea principal al [phone] para una actualizacin sobre el estado de cualquier retraso o cierre.  Consejos para la medicacin en dermatologa: Por favor, guarde las cajas en las que vienen los medicamentos de uso tpico para ayudarle a seguir las instrucciones sobre dnde y cmo usarlos. Las farmacias generalmente imprimen las instrucciones del medicamento slo en las cajas y no directamente en los tubos del medicamento.   Si su medicamento es muy caro, por favor, pngase en contacto con nuestra oficina llamando al [phone] y presione la opcin 4 o envenos un mensaje a travs de MyChart.   No podemos decirle cul ser su copago por los medicamentos por adelantado ya que esto es diferente dependiendo de la cobertura de su seguro.  Sin embargo, es posible que podamos encontrar un medicamento sustituto a menor costo o llenar un formulario para que el seguro cubra el medicamento que se considera necesario.   Si se requiere una autorizacin previa para que su compaa de seguros cubra su medicamento, por favor permtanos de 1 a 2 das hbiles para completar este proceso.  Los precios de los medicamentos varan con frecuencia dependiendo del lugar de dnde se surte la receta y alguna farmacias pueden ofrecer precios ms baratos.  El sitio web www.goodrx.com tiene cupones para medicamentos de diferentes farmacias. Los precios aqu no tienen en cuenta lo que podra costar con la ayuda del seguro (puede ser ms barato con su seguro), pero el sitio web puede darle el precio si no utiliz ningn seguro.  - Puede imprimir el cupn correspondiente y llevarlo con su receta a la farmacia.  - Tambin puede pasar por nuestra oficina durante el horario de atencin regular y recoger una tarjeta de cupones de GoodRx.  -   Si necesita que su receta se enve electrnicamente a una farmacia diferente, informe a nuestra oficina a travs de MyChart de [facility] o por telfono llamando al [phone] y presione la opcin 4.  

## 2022-11-08 NOTE — Progress Notes (Signed)
Follow-Up Visit   Subjective  Brianna Stevens is a 82 y.o. female who presents for the following: Recheck Aks, nose, upper lip, 41m f/u, pt thinks cleared but has a new scaly spot on nose, check spot R shoulder, irritating  The patient has spots, moles and lesions to be evaluated, some may be new or changing and the patient may have concern these could be cancer.  The following portions of the chart were reviewed this encounter and updated as appropriate: medications, allergies, medical history Review of Systems:  No other skin or systemic complaints except as noted in HPI or Assessment and Plan.  Objective  Well appearing patient in no apparent distress; mood and affect are within normal limits.  A focused examination was performed of the following areas: Face, arms, hands  Relevant exam findings are noted in the Assessment and Plan.  L nasal tip x 1, R medial cheek x 1 (2) Pink scaly macules  L zygoma sideburn x 1, R post shoulder x 1 (2) Stuck on waxy paps with erythema  L cheek x 1 Yellow lobulated pap   Assessment & Plan   ACTINIC DAMAGE - chronic, secondary to cumulative UV radiation exposure/sun exposure over time - diffuse scaly erythematous macules with underlying dyspigmentation - Recommend daily broad spectrum sunscreen SPF 30+ to sun-exposed areas, reapply every 2 hours as needed.  - Recommend staying in the shade or wearing long sleeves, sun glasses (UVA+UVB protection) and wide brim hats (4-inch brim around the entire circumference of the hat). - Call for new or changing lesions.   SEBORRHEIC KERATOSIS - Stuck-on, waxy, tan-brown papules and/or plaques  - Benign-appearing - Discussed benign etiology and prognosis. - Observe - Call for any changes  LENTIGINES Exam: scattered tan macules Due to sun exposure Treatment Plan: Benign-appearing, observe. Recommend daily broad spectrum sunscreen SPF 30+ to sun-exposed areas, reapply every 2 hours as needed.   Call for any changes   AK (actinic keratosis) (2) L nasal tip x 1, R medial cheek x 1  Destruction of lesion - L nasal tip x 1, R medial cheek x 1 Complexity: simple   Destruction method: cryotherapy   Informed consent: discussed and consent obtained   Timeout:  patient name, date of birth, surgical site, and procedure verified Lesion destroyed using liquid nitrogen: Yes   Region frozen until ice ball extended beyond lesion: Yes   Outcome: patient tolerated procedure well with no complications   Post-procedure details: wound care instructions given    Inflamed seborrheic keratosis (2) L zygoma sideburn x 1, R post shoulder x 1  Symptomatic, irritating, patient would like treated.   Destruction of lesion - L zygoma sideburn x 1, R post shoulder x 1 Complexity: simple   Destruction method: cryotherapy   Informed consent: discussed and consent obtained   Timeout:  patient name, date of birth, surgical site, and procedure verified Lesion destroyed using liquid nitrogen: Yes   Region frozen until ice ball extended beyond lesion: Yes   Outcome: patient tolerated procedure well with no complications   Post-procedure details: wound care instructions given    Sebaceous hyperplasia L cheek x 1  With pruritus   Destruction of lesion - L cheek x 1 Complexity: simple   Destruction method: cryotherapy   Informed consent: discussed and consent obtained   Timeout:  patient name, date of birth, surgical site, and procedure verified Lesion destroyed using liquid nitrogen: Yes   Region frozen until ice ball extended beyond lesion: Yes  Outcome: patient tolerated procedure well with no complications   Post-procedure details: wound care instructions given     Return in about 1 year (around 11/08/2023) for TBSE, Hx of AKs.  I, Ardis Rowan, RMA, am acting as scribe for Armida Sans, MD .  Documentation: I have reviewed the above documentation for accuracy and completeness, and I agree  with the above.  Armida Sans, MD

## 2022-11-14 ENCOUNTER — Encounter: Payer: Self-pay | Admitting: Dermatology

## 2022-11-19 ENCOUNTER — Other Ambulatory Visit: Payer: Self-pay | Admitting: Family Medicine

## 2022-11-21 NOTE — Telephone Encounter (Signed)
Requested Prescriptions  Pending Prescriptions Disp Refills   omeprazole (PRILOSEC) 20 MG capsule [Pharmacy Med Name: OMEPRAZOLE DR 20 MG CAPSULE] 180 capsule 0    Sig: TAKE 1 CAPSULE BY MOUTH TWICE A DAY     Gastroenterology: Proton Pump Inhibitors Passed - 11/19/2022  8:46 AM      Passed - Valid encounter within last 12 months    Recent Outpatient Visits           5 months ago Encounter for [employer] annual wellness exam   [place] [facility] [address] [place], Megan P, DO   11 months ago Pal[place]   [place] [facility] Vigg, Avanti, MD   1 year ago Suspected COVID-19 virus infection   [place] [facility] Vigg, Avanti, MD   1 year ago Screening for osteoporosis   [place] [facility] Vigg, Avanti, MD   1 year ago Reflux gastritis   [place] [facility] [place], [place], DO       Future Appointments             In 3 weeks Laural Benes, Oralia Rud, DO  [facility]   In 11 months Deirdre Evener, MD [facility]

## 2022-11-23 ENCOUNTER — Other Ambulatory Visit: Payer: Self-pay | Admitting: Family Medicine

## 2022-11-23 DIAGNOSIS — H6123 Impacted cerumen, bilateral: Secondary | ICD-10-CM

## 2022-11-23 DIAGNOSIS — E7800 Pure hypercholesterolemia, unspecified: Secondary | ICD-10-CM

## 2022-11-23 NOTE — Telephone Encounter (Signed)
Requested Prescriptions  Pending Prescriptions Disp Refills   losartan (COZAAR) 25 MG tablet [Pharmacy Med Name: LOSARTAN POTASSIUM 25 MG TAB] 90 tablet 0    Sig: TAKE 1 TABLET (25 MG TOTAL) BY MOUTH DAILY.     Cardiovascular:  Angiotensin Receptor Blockers Failed - 11/23/2022  2:16 AM      Failed - Last BP in normal range    BP Readings from Last 1 Encounters:  11/08/22 (!) 150/85         Passed - Cr in normal range and within 180 days    Creatinine, Ser  Date Value Ref Range Status  06/13/2022 0.94 0.57 - 1.00 mg/dL Final         Passed - K in normal range and within 180 days    Potassium  Date Value Ref Range Status  06/13/2022 5.0 3.5 - 5.2 mmol/L Final         Passed - Patient is not pregnant      Passed - Valid encounter within last 6 months    Recent Outpatient Visits           5 months ago Encounter for [employer] annual wellness exam   [place] [facility] [place], Megan P, DO   11 months ago Palpitation   [place] [facility] Vigg, Avanti, MD   1 year ago Suspected COVID-19 virus infection   [place] [facility] Vigg, Avanti, MD   1 year ago Screening for osteoporosis   [place] [facility] Vigg, Avanti, MD   1 year ago Reflux gastritis   [place] [facility] [place], [place], DO       Future Appointments             In 3 weeks Laural Benes, Oralia Rud, DO [facility] [facility]   In 11 months Deirdre Evener, MD [facility]

## 2022-12-18 ENCOUNTER — Ambulatory Visit: Payer: PPO | Admitting: Family Medicine

## 2022-12-20 ENCOUNTER — Ambulatory Visit (INDEPENDENT_AMBULATORY_CARE_PROVIDER_SITE_OTHER): Payer: PPO | Admitting: Family Medicine

## 2022-12-20 ENCOUNTER — Encounter: Payer: Self-pay | Admitting: Family Medicine

## 2022-12-20 VITALS — BP 130/80 | HR 59 | Temp 97.9°F | Ht 63.0 in | Wt 149.2 lb

## 2022-12-20 DIAGNOSIS — H6121 Impacted cerumen, right ear: Secondary | ICD-10-CM | POA: Diagnosis not present

## 2022-12-20 DIAGNOSIS — R7301 Impaired fasting glucose: Secondary | ICD-10-CM | POA: Diagnosis not present

## 2022-12-20 DIAGNOSIS — E7800 Pure hypercholesterolemia, unspecified: Secondary | ICD-10-CM | POA: Diagnosis not present

## 2022-12-20 DIAGNOSIS — I10 Essential (primary) hypertension: Secondary | ICD-10-CM | POA: Diagnosis not present

## 2022-12-20 DIAGNOSIS — R7309 Other abnormal glucose: Secondary | ICD-10-CM | POA: Diagnosis not present

## 2022-12-20 LAB — BAYER DCA HB A1C WAIVED: HB A1C (BAYER DCA - WAIVED): 5.9 % — ABNORMAL HIGH (ref 4.8–5.6)

## 2022-12-20 MED ORDER — SIMVASTATIN 20 MG PO TABS: ORAL_TABLET | ORAL | 1 refills | Status: DC

## 2022-12-20 MED ORDER — DEBROX 6.5 % OT SOLN
5.0000 [drp] | Freq: Two times a day (BID) | OTIC | 12 refills | Status: AC
Start: 2022-12-20 — End: ?

## 2022-12-20 MED ORDER — OMEPRAZOLE 20 MG PO CPDR: 20.0000 mg | DELAYED_RELEASE_CAPSULE | Freq: Two times a day (BID) | ORAL | 1 refills | Status: DC

## 2022-12-20 MED ORDER — LOSARTAN POTASSIUM 25 MG PO TABS: 25.0000 mg | ORAL_TABLET | Freq: Every day | ORAL | 1 refills | Status: DC

## 2022-12-20 NOTE — Assessment & Plan Note (Signed)
Under good control on current regimen. Continue current regimen. Continue to monitor. Call with any concerns. Refills given. Labs drawn today.   

## 2022-12-20 NOTE — Progress Notes (Signed)
BP 130/80   Pulse (!) 59   Temp 97.9 F (36.6 C) (Oral)   Ht 5\' 3"  (1.6 m)   Wt 149 lb 3.2 oz (67.7 kg)   SpO2 97%   BMI 26.43 kg/m    Subjective:    Patient ID: Brianna Stevens, female    DOB: 04-29-1941, 82 y.o.   MRN: [id number]  HPI: Brianna Stevens is a 82 y.o. female  Chief Complaint  Patient presents with   Hypertension   Ear Problem    Patient says she has not been able to hear so well in her R ear and would like the provider to take a look as she has been using drops to help with wax.    Hyperlipidemia   HYPERTENSION / HYPERLIPIDEMIA Satisfied with current treatment? yes Duration of hypertension: chronic BP monitoring frequency: not checking BP medication side effects: no Past BP meds: losartan Duration of hyperlipidemia: chronic Cholesterol medication side effects: no Cholesterol supplements: none Past cholesterol medications: simvastatin Medication compliance: excellent compliance Aspirin: yes Recent stressors: no Recurrent headaches: no Visual changes: no Palpitations: no Dyspnea: no Chest pain: no Lower extremity edema: no Dizzy/lightheaded: no  Impaired Fasting Glucose HbA1C: 5.9 Duration of elevated blood sugar: chronic Polydipsia: no Polyuria: no Weight change: no Visual disturbance: no Glucose Monitoring: no Diabetic Education: Not Completed Family history of diabetes: no  Relevant past medical, surgical, family and social history reviewed and updated as indicated. Interim medical history since our last visit reviewed. Allergies and medications reviewed and updated.  Review of Systems  Constitutional: Negative.   HENT:  Positive for hearing loss. Negative for congestion, dental problem, drooling, ear discharge, ear pain, facial swelling, mouth sores, nosebleeds, postnasal drip, rhinorrhea, sinus pressure, sinus pain, sneezing, sore throat, tinnitus, trouble swallowing and voice change.   Respiratory: Negative.    Cardiovascular:  Negative.   Gastrointestinal: Negative.   Musculoskeletal: Negative.   Neurological: Negative.   Psychiatric/Behavioral: Negative.      Per HPI unless specifically indicated above     Objective:    BP 130/80   Pulse (!) 59   Temp 97.9 F (36.6 C) (Oral)   Ht 5\' 3"  (1.6 m)   Wt 149 lb 3.2 oz (67.7 kg)   SpO2 97%   BMI 26.43 kg/m   Wt Readings from Last 3 Encounters:  12/20/22 149 lb 3.2 oz (67.7 kg)  06/13/22 145 lb 12.8 oz (66.1 kg)  01/17/22 146 lb 8 oz (66.5 kg)    Physical Exam Vitals and nursing note reviewed.  Constitutional:      General: She is not in acute distress.    Appearance: Normal appearance. She is normal weight. She is not ill-appearing, toxic-appearing or diaphoretic.  HENT:     Head: Normocephalic and atraumatic.     Right Ear: External ear normal.     Left Ear: External ear normal.     Nose: Nose normal.     Mouth/Throat:     Mouth: Mucous membranes are moist.     Pharynx: Oropharynx is clear.  Eyes:     General: No scleral icterus.       Right eye: No discharge.        Left eye: No discharge.     Extraocular Movements: Extraocular movements intact.     Conjunctiva/sclera: Conjunctivae normal.     Pupils: Pupils are equal, round, and reactive to light.  Cardiovascular:     Rate and Rhythm: Normal rate and regular rhythm.  Pulses: Normal pulses.     Heart sounds: Murmur heard.     No friction rub. No gallop.  Pulmonary:     Effort: Pulmonary effort is normal. No respiratory distress.     Breath sounds: Normal breath sounds. No stridor. No wheezing, rhonchi or rales.  Chest:     Chest wall: No tenderness.  Musculoskeletal:        General: Normal range of motion.     Cervical back: Normal range of motion and neck supple.  Skin:    General: Skin is warm and dry.     Capillary Refill: Capillary refill takes less than 2 seconds.     Coloration: Skin is not jaundiced or pale.     Findings: No bruising, erythema, lesion or rash.   Neurological:     General: No focal deficit present.     Mental Status: She is alert and oriented to person, place, and time. Mental status is at baseline.  Psychiatric:        Mood and Affect: Mood normal.        Behavior: Behavior normal.        Thought Content: Thought content normal.        Judgment: Judgment normal.     Results for orders placed or performed in visit on 06/13/22  HM DEXA SCAN  Result Value Ref Range   HM Dexa Scan see report scanned into chart       Assessment & Plan:   Problem List Items Addressed This Visit       Cardiovascular and Mediastinum   HTN (hypertension) - Primary    Under good control on current regimen. Continue current regimen. Continue to monitor. Call with any concerns. Refills given. Labs drawn today.        Relevant Medications   losartan (COZAAR) 25 MG tablet   simvastatin (ZOCOR) 20 MG tablet   Other Relevant Orders   CBC with Differential/Platelet   Comprehensive metabolic panel     Endocrine   IFG (impaired fasting glucose)    Doing well with A1c of 5.9. Continue diet and exercise. Call with any concerns.       Relevant Orders   Bayer DCA Hb A1c Waived   CBC with Differential/Platelet   Comprehensive metabolic panel     Other   Hypercholesteremia    Under good control on current regimen. Continue current regimen. Continue to monitor. Call with any concerns. Refills given. Labs drawn today.       Relevant Medications   losartan (COZAAR) 25 MG tablet   simvastatin (ZOCOR) 20 MG tablet   Other Relevant Orders   CBC with Differential/Platelet   Lipid Panel w/o Chol/HDL Ratio   Comprehensive metabolic panel   Other Visit Diagnoses     Hearing loss of right ear due to cerumen impaction       Ear flushed today with good results.   Relevant Medications   carbamide peroxide (DEBROX) 6.5 % OTIC solution        Follow up plan: Return in about 6 months (around 06/21/2023) for physical.

## 2022-12-20 NOTE — Assessment & Plan Note (Signed)
Doing well with A1c of 5.9. Continue diet and exercise. Call with any concerns.  

## 2022-12-21 ENCOUNTER — Encounter: Payer: Self-pay | Admitting: Family Medicine

## 2022-12-21 LAB — COMPREHENSIVE METABOLIC PANEL
ALT: 13 IU/L (ref 0–32)
AST: 21 IU/L (ref 0–40)
Albumin: 4.3 g/dL (ref 3.7–4.7)
Alkaline Phosphatase: 76 IU/L (ref 44–121)
BUN/Creatinine Ratio: 19 (ref 12–28)
BUN: 21 mg/dL (ref 8–27)
Bilirubin Total: 0.4 mg/dL (ref 0.0–1.2)
CO2: 24 mmol/L (ref 20–29)
Calcium: 9.6 mg/dL (ref 8.7–10.3)
Chloride: 104 mmol/L (ref 96–106)
Creatinine, Ser: 1.1 mg/dL — ABNORMAL HIGH (ref 0.57–1.00)
Globulin, Total: 2.9 g/dL (ref 1.5–4.5)
Glucose: 86 mg/dL (ref 70–99)
Potassium: 4.5 mmol/L (ref 3.5–5.2)
Sodium: 141 mmol/L (ref 134–144)
Total Protein: 7.2 g/dL (ref 6.0–8.5)
eGFR: 50 mL/min/{1.73_m2} — ABNORMAL LOW (ref >59)

## 2022-12-21 LAB — CBC WITH DIFFERENTIAL/PLATELET
Basophils Absolute: 0 10*3/uL (ref 0.0–0.2)
Basos: 1 %
EOS (ABSOLUTE): 0.2 10*3/uL (ref 0.0–0.4)
Eos: 3 %
Hematocrit: 44.7 % (ref 34.0–46.6)
Hemoglobin: 14.5 g/dL (ref 11.1–15.9)
Immature Grans (Abs): 0 10*3/uL (ref 0.0–0.1)
Immature Granulocytes: 0 %
Lymphocytes Absolute: 4.2 10*3/uL — ABNORMAL HIGH (ref 0.7–3.1)
Lymphs: 56 %
MCH: 30.3 pg (ref 26.6–33.0)
MCHC: 32.4 g/dL (ref 31.5–35.7)
MCV: 93 fL (ref 79–97)
Monocytes Absolute: 0.6 10*3/uL (ref 0.1–0.9)
Monocytes: 8 %
Neutrophils Absolute: 2.4 10*3/uL (ref 1.4–7.0)
Neutrophils: 32 %
Platelets: 214 10*3/uL (ref 150–450)
RBC: 4.79 x10E6/uL (ref 3.77–5.28)
RDW: 12 % (ref 11.7–15.4)
WBC: 7.5 10*3/uL (ref 3.4–10.8)

## 2022-12-21 LAB — LIPID PANEL W/O CHOL/HDL RATIO
Cholesterol, Total: 168 mg/dL (ref 100–199)
HDL: 58 mg/dL (ref >39)
LDL Chol Calc (NIH): 92 mg/dL (ref 0–99)
Triglycerides: 98 mg/dL (ref 0–149)
VLDL Cholesterol Cal: 18 mg/dL (ref 5–40)

## 2023-06-13 ENCOUNTER — Other Ambulatory Visit: Payer: Self-pay | Admitting: Family Medicine

## 2023-06-13 DIAGNOSIS — E7800 Pure hypercholesterolemia, unspecified: Secondary | ICD-10-CM

## 2023-06-13 NOTE — Telephone Encounter (Signed)
Requested Prescriptions  Pending Prescriptions Disp Refills   simvastatin (ZOCOR) 20 MG tablet [Pharmacy Med Name: SIMVASTATIN 20 MG TABLET] 90 tablet 1    Sig: TAKE 1 TABLET BY MOUTH EVERY DAY     Cardiovascular:  Antilipid - Statins Failed - 06/13/2023  1:30 AM      Failed - Lipid Panel in normal range within the last 12 months    Cholesterol, Total  Date Value Ref Range Status  12/20/2022 168 100 - 199 mg/dL Final   Cholesterol Piccolo, Waived  Date Value Ref Range Status  12/17/2017 172 <200 mg/dL Final    Comment:                            Desirable                <200                         Borderline High      200- 239                         High                     >239    LDL Chol Calc (NIH)  Date Value Ref Range Status  12/20/2022 92 0 - 99 mg/dL Final   HDL  Date Value Ref Range Status  12/20/2022 58 >39 mg/dL Final   Triglycerides  Date Value Ref Range Status  12/20/2022 98 0 - 149 mg/dL Final   Triglycerides Piccolo,Waived  Date Value Ref Range Status  12/17/2017 72 <150 mg/dL Final    Comment:                            Normal                   <150                         Borderline High     150 - 199                         High                200 - 499                         Very High                >499          Passed - Patient is not pregnant      Passed - Valid encounter within last 12 months    Recent Outpatient Visits           5 months ago Primary hypertension   [place] [facility] Xenia, [place]en[place], DO   1 year ago Encounter for [employer] annual wellness exam   [place] [facility] [place], Megan P, DO   1 year ago Palpitation   [facility] Vigg, Avanti, MD   2 years ago Suspected COVID-19 virus infection   [place] [facility] Vigg, Avanti, MD   2 years ago Screening for osteoporosis    [facility]  Loura Pardon, MD       Future  Appointments             In 1 week Dorcas Carrow, DO [place] [facility]   In 5 months Deirdre Evener, MD [facility]

## 2023-06-21 ENCOUNTER — Encounter: Payer: Self-pay | Admitting: Family Medicine

## 2023-06-21 ENCOUNTER — Ambulatory Visit (INDEPENDENT_AMBULATORY_CARE_PROVIDER_SITE_OTHER): Payer: PPO | Admitting: Family Medicine

## 2023-06-21 VITALS — BP 156/90 | HR 62 | Ht 62.0 in | Wt 150.6 lb

## 2023-06-21 DIAGNOSIS — R7301 Impaired fasting glucose: Secondary | ICD-10-CM

## 2023-06-21 DIAGNOSIS — I10 Essential (primary) hypertension: Secondary | ICD-10-CM | POA: Diagnosis not present

## 2023-06-21 DIAGNOSIS — Z0000 Encounter for general adult medical examination without abnormal findings: Secondary | ICD-10-CM | POA: Diagnosis not present

## 2023-06-21 DIAGNOSIS — E7800 Pure hypercholesterolemia, unspecified: Secondary | ICD-10-CM

## 2023-06-21 DIAGNOSIS — H6123 Impacted cerumen, bilateral: Secondary | ICD-10-CM

## 2023-06-21 LAB — BAYER DCA HB A1C WAIVED: HB A1C (BAYER DCA - WAIVED): 5.7 % — ABNORMAL HIGH (ref 4.8–5.6)

## 2023-06-21 LAB — MICROALBUMIN, URINE WAIVED
Creatinine, Urine Waived: 100 mg/dL (ref 10–300)
Microalb, Ur Waived: 80 mg/L — ABNORMAL HIGH (ref 0–19)

## 2023-06-21 MED ORDER — OMEPRAZOLE 20 MG PO CPDR: 20.0000 mg | DELAYED_RELEASE_CAPSULE | Freq: Two times a day (BID) | ORAL | 1 refills | Status: DC

## 2023-06-21 MED ORDER — LOSARTAN POTASSIUM 50 MG PO TABS: 50.0000 mg | ORAL_TABLET | Freq: Every day | ORAL | 2 refills | Status: DC

## 2023-06-21 MED ORDER — TRAZODONE HCL 50 MG PO TABS: 25.0000 mg | ORAL_TABLET | Freq: Every evening | ORAL | 3 refills | Status: DC | PRN

## 2023-06-21 NOTE — Assessment & Plan Note (Signed)
Doing well with A1c of 5.7 down from 5.9. Continue diet and exercise. Call with any concerns.

## 2023-06-21 NOTE — Assessment & Plan Note (Signed)
Flush is broken. Will get her back next week to flush ears.

## 2023-06-21 NOTE — Assessment & Plan Note (Signed)
Under good control on current regimen. Continue current regimen. Continue to monitor. Call with any concerns. Refills given. Labs drawn today.   

## 2023-06-21 NOTE — Progress Notes (Signed)
BP (!) 156/90   Pulse 62   Ht 5\' 2"  (1.575 m)   Wt 150 lb 9.6 oz (68.3 kg)   SpO2 98%   BMI 27.55 kg/m    Subjective:    Patient ID: Brianna Stevens, female    DOB: 1940/09/28, 82 y.o.   MRN: [id number]  HPI: Brianna Stevens is a 82 y.o. female presenting on 06/21/2023 for comprehensive medical examination. Current medical complaints include:  Impaired Fasting Glucose HbA1C:  Lab Results  Component Value Date   HGBA1C 5.7 (H) 06/21/2023   Duration of elevated blood sugar: chronic Polydipsia: no Polyuria: no Weight change: no Visual disturbance: no Glucose Monitoring: no    Accucheck frequency: Not Checking Diabetic Education: Not Completed Family history of diabetes: no  HYPERTENSION / HYPERLIPIDEMIA Satisfied with current treatment? yes Duration of hypertension: chronic BP monitoring frequency: not checking BP medication side effects: no Past BP meds: losartan Duration of hyperlipidemia: chronic Cholesterol medication side effects: no Cholesterol supplements: none Past cholesterol medications: simvastatin Medication compliance: excellent compliance Aspirin: yes Recent stressors: no Recurrent headaches: no Visual changes: no Palpitations: no Dyspnea: no Chest pain: no Lower extremity edema: no Dizzy/lightheaded: no  Menopausal Symptoms: no  Functional Status Survey: Is the patient deaf or have difficulty hearing?: No Does the patient have difficulty seeing, even when wearing glasses/contacts?: No Does the patient have difficulty concentrating, remembering, or making decisions?: Yes Does the patient have difficulty walking or climbing stairs?: No Does the patient have difficulty dressing or bathing?: No Does the patient have difficulty doing errands alone such as visiting a doctor's office or shopping?: No     06/21/2023   10:31 AM 06/13/2022    2:47 PM 12/06/2021   10:19 AM 06/06/2021    9:12 AM 01/11/2021    9:48 AM  Fall Risk   Falls in the past  year? 0 0 0 0 1  Number falls in past yr: 0 0 0 0 0  Injury with Fall? 0 0 0 0 1  Risk for fall due to : No Fall Risks No Fall Risks No Fall Risks  Other (Comment)  Follow up Falls evaluation completed Falls evaluation completed Falls evaluation completed Falls evaluation completed;Falls prevention discussed Falls evaluation completed    Depression Screen    06/21/2023   10:31 AM 12/20/2022    9:36 AM 06/13/2022    2:47 PM 12/06/2021   10:19 AM 06/06/2021    9:22 AM  Depression screen PHQ 2/9  Decreased Interest 0 0 0 0 0  Down, Depressed, Hopeless 0 0 0 0 0  PHQ - 2 Score 0 0 0 0 0  Altered sleeping 3 2 1 1    Tired, decreased energy 0 0 0 0   Change in appetite 0 0 0 0   Feeling bad or failure about yourself  0 0 0 0   Trouble concentrating 0 0 0 0   Moving slowly or fidgety/restless 0 0 0 0   Suicidal thoughts 0 0 0 0   PHQ-9 Score 3 2 1 1    Difficult doing work/chores Not difficult at all Not difficult at all Not difficult at all Not difficult at all    Advanced Directives Does patient have a HCPOA?    no If yes, name and contact information:  Does patient have a living will or MOST form?  no  Past Medical History:  Past Medical History:  Diagnosis Date   Actinic keratosis    GERD (gastroesophageal reflux  disease)    Hx of adenomatous colonic polyps    Hyperlipidemia     Surgical History:  Past Surgical History:  Procedure Laterality Date   APPENDECTOMY     BREAST CYST ASPIRATION Bilateral    CHOLECYSTECTOMY     COLONOSCOPY     COLONOSCOPY WITH PROPOFOL N/A 09/10/2019   Procedure: COLONOSCOPY WITH PROPOFOL;  Surgeon: Toledo, Boykin Nearing, MD;  Location: [facility] ENDOSCOPY;  Service: Gastroenterology;  Laterality: N/A;   ESOPHAGOGASTRODUODENOSCOPY     FLEXIBLE SIGMOIDOSCOPY     VARICOSE VEIN SURGERY Bilateral    done at [facility]    Medications:  Current Outpatient Medications on File Prior to Visit  Medication Sig   aspirin EC 81 MG tablet Take 81  mg by mouth daily.   calcium-vitamin D 250-100 MG-UNIT per tablet Take 1 tablet by mouth 2 (two) times daily.   carbamide peroxide (DEBROX) 6.5 % OTIC solution Place 5 drops into both ears 2 (two) times daily.   meclizine (ANTIVERT) 25 MG tablet Take 1 tablet (25 mg total) by mouth 3 (three) times daily as needed.   Multiple Vitamin (MULTI-VITAMINS) TABS Take by mouth.   simvastatin (ZOCOR) 20 MG tablet TAKE 1 TABLET BY MOUTH EVERY DAY   No current facility-administered medications on file prior to visit.    Allergies:  Allergies  Allergen Reactions   Pantoprazole Palpitations    Social History:  Social History   Socioeconomic History   Marital status: Widowed    Spouse name: Not on file   Number of children: Not on file   Years of education: 13   Highest education level: Some college, no degree  Occupational History   Occupation: retired  Tobacco Use   Smoking status: Never   Smokeless tobacco: Never  Vaping Use   Vaping status: Never Used  Substance and Sexual Activity   Alcohol use: Yes    Alcohol/week: 1.0 standard drink of alcohol    Types: 1 Glasses of wine per week    Comment: social   Drug use: No   Sexual activity: Not Currently  Other Topics Concern   Not on file  Social History Narrative   Senior citizens activities    Social Drivers of Health   Financial Resource Strain: Low Risk  (06/06/2021)   Overall Financial Resource Strain (CARDIA)    Difficulty of Paying Living Expenses: Not hard at all  Food Insecurity: No Food Insecurity (06/06/2021)   Hunger Vital Sign    Worried About Running Out of Food in the Last Year: Never true    Ran Out of Food in the Last Year: Never true  Transportation Needs: No Transportation Needs (06/06/2021)   PRAPARE - Administrator, Civil Service (Medical): No    Lack of Transportation (Non-Medical): No  Physical Activity: Sufficiently Active (06/06/2021)   Exercise Vital Sign    Days of Exercise per Week: 6  days    Minutes of Exercise per Session: 50 min  Stress: No Stress Concern Present (06/06/2021)   [employer] of Occupational Health - Occupational Stress Questionnaire    Feeling of Stress : Not at all  Social Connections: Moderately Isolated (06/06/2021)   Social Connection and Isolation Panel [NHANES]    Frequency of Communication with Friends and Family: More than three times a week    Frequency of Social Gatherings with Friends and Family: Three times a week    Attends Religious Services: More than 4 times per year  Active Member of Clubs or Organizations: No    Attends Banker Meetings: Never    Marital Status: Widowed  Intimate Partner Violence: Not At Risk (06/06/2021)   Humiliation, Afraid, Rape, and Kick questionnaire    Fear of Current or Ex-Partner: No    Emotionally Abused: No    Physically Abused: No    Sexually Abused: No   Social History   Tobacco Use  Smoking Status Never  Smokeless Tobacco Never   Social History   Substance and Sexual Activity  Alcohol Use Yes   Alcohol/week: 1.0 standard drink of alcohol   Types: 1 Glasses of wine per week   Comment: social    Family History:  Family History  Problem Relation Age of Onset   Prostate cancer Brother 63   Prostate cancer Brother 18   Breast cancer Neg Hx     Past medical history, surgical history, medications, allergies, family history and social history reviewed with patient today and changes made to appropriate areas of the chart.   Review of Systems  Constitutional: Negative.   HENT:  Positive for ear pain. Negative for congestion, ear discharge, hearing loss, nosebleeds, sinus pain, sore throat and tinnitus.   Eyes: Negative.   Respiratory: Negative.  Negative for stridor.   Cardiovascular:  Positive for leg swelling. Negative for chest pain, palpitations, orthopnea, claudication and PND.  Gastrointestinal: Negative.   Genitourinary: Negative.   Musculoskeletal: Negative.    Skin: Negative.   Neurological: Negative.   Endo/Heme/Allergies: Negative.   Psychiatric/Behavioral: Negative.      All other ROS negative except what is listed above and in the HPI.      Objective:    BP (!) 156/90   Pulse 62   Ht 5\' 2"  (1.575 m)   Wt 150 lb 9.6 oz (68.3 kg)   SpO2 98%   BMI 27.55 kg/m   Wt Readings from Last 3 Encounters:  06/21/23 150 lb 9.6 oz (68.3 kg)  12/20/22 149 lb 3.2 oz (67.7 kg)  06/13/22 145 lb 12.8 oz (66.1 kg)     Physical Exam Vitals and nursing note reviewed.  Constitutional:      General: She is not in acute distress.    Appearance: Normal appearance. She is normal weight. She is not ill-appearing, toxic-appearing or diaphoretic.  HENT:     Head: Normocephalic and atraumatic.     Right Ear: Tympanic membrane, ear canal and external ear normal. There is impacted cerumen.     Left Ear: Tympanic membrane, ear canal and external ear normal. There is impacted cerumen.     Nose: Nose normal. No congestion or rhinorrhea.     Mouth/Throat:     Mouth: Mucous membranes are moist.     Pharynx: Oropharynx is clear. No oropharyngeal exudate or posterior oropharyngeal erythema.  Eyes:     General: No scleral icterus.       Right eye: No discharge.        Left eye: No discharge.     Extraocular Movements: Extraocular movements intact.     Conjunctiva/sclera: Conjunctivae normal.     Pupils: Pupils are equal, round, and reactive to light.  Neck:     Vascular: No carotid bruit.  Cardiovascular:     Rate and Rhythm: Normal rate and regular rhythm.     Pulses: Normal pulses.     Heart sounds: No murmur heard.    No friction rub. No gallop.  Pulmonary:     Effort: Pulmonary effort  is normal. No respiratory distress.     Breath sounds: Normal breath sounds. No stridor. No wheezing, rhonchi or rales.  Chest:     Chest wall: No tenderness.  Abdominal:     General: Abdomen is flat. Bowel sounds are normal. There is no distension.     Palpations:  Abdomen is soft. There is no mass.     Tenderness: There is no abdominal tenderness. There is no right CVA tenderness, left CVA tenderness, guarding or rebound.     Hernia: No hernia is present.  Genitourinary:    Comments: Breast and pelvic exams deferred with shared decision making Musculoskeletal:        General: No swelling, tenderness, deformity or signs of injury.     Cervical back: Normal range of motion and neck supple. No rigidity. No muscular tenderness.     Right lower leg: No edema.     Left lower leg: No edema.  Lymphadenopathy:     Cervical: No cervical adenopathy.  Skin:    General: Skin is warm and dry.     Capillary Refill: Capillary refill takes less than 2 seconds.     Coloration: Skin is not jaundiced or pale.     Findings: No bruising, erythema, lesion or rash.  Neurological:     General: No focal deficit present.     Mental Status: She is alert and oriented to person, place, and time. Mental status is at baseline.     Cranial Nerves: No cranial nerve deficit.     Sensory: No sensory deficit.     Motor: No weakness.     Coordination: Coordination normal.     Gait: Gait normal.     Deep Tendon Reflexes: Reflexes normal.  Psychiatric:        Mood and Affect: Mood normal.        Behavior: Behavior normal.        Thought Content: Thought content normal.        Judgment: Judgment normal.        06/21/2023   10:53 AM 06/13/2022    3:05 PM 12/25/2018    9:39 AM 12/17/2017    9:51 AM 11/29/2016   10:43 AM  6CIT Screen  What Year? 0 points 0 points 0 points 0 points 0 points  What month? 0 points 0 points 0 points 0 points 0 points  What time? 0 points 0 points 0 points 0 points 0 points  Count back from 20 0 points 0 points 0 points 0 points 0 points  Months in reverse 0 points 0 points 0 points 0 points 2 points  Repeat phrase 2 points 4 points 0 points 4 points 2 points  Total Score 2 points 4 points 0 points 4 points 4 points    Results for orders  placed or performed in visit on 06/21/23  Bayer DCA Hb A1c Waived   Collection Time: 06/21/23 10:33 AM  Result Value Ref Range   HB A1C (BAYER DCA - WAIVED) 5.7 (H) 4.8 - 5.6 %  Microalbumin, Urine Waived   Collection Time: 06/21/23 10:33 AM  Result Value Ref Range   Microalb, Ur Waived 80 (H) 0 - 19 mg/L   Creatinine, Urine Waived 100 10 - 300 mg/dL   Microalb/Creat Ratio 30-300 (H) <30 mg/g      Assessment & Plan:   Problem List Items Addressed This Visit       Cardiovascular and Mediastinum   HTN (hypertension)   Running high. Will  increase her losartan to 50mg  and recheck 1 month. Call with any concerns.       Relevant Medications   losartan (COZAAR) 50 MG tablet   Other Relevant Orders   CBC with Differential/Platelet   Comprehensive metabolic panel   TSH   Microalbumin, Urine Waived (Completed)     Endocrine   IFG (impaired fasting glucose)   Doing well with A1c of 5.7 down from 5.9. Continue diet and exercise. Call with any concerns.       Relevant Orders   Bayer DCA Hb A1c Waived (Completed)   CBC with Differential/Platelet   Comprehensive metabolic panel   Microalbumin, Urine Waived (Completed)     Nervous and Auditory   Bilateral hearing loss due to cerumen impaction   Flush is broken. Will get her back next week to flush ears.         Other   Hypercholesteremia   Under good control on current regimen. Continue current regimen. Continue to monitor. Call with any concerns. Refills given. Labs drawn today.       Relevant Medications   losartan (COZAAR) 50 MG tablet   Other Relevant Orders   CBC with Differential/Platelet   Comprehensive metabolic panel   Lipid Panel w/o Chol/HDL Ratio   Other Visit Diagnoses       Encounter for Medicare annual wellness exam    -  Primary   Preventative care discussed today as below.     Routine general medical examination at a health care facility       Vaccines up to date/declined. Screening labs checked  today. DEXA up to date. Continue diet and exercise. Call with any concerns.        Preventative Services:  Health Risk Assessment and Personalized Prevention Plan: done today Bone Mass Measurements: up to date Breast Cancer Screening: N/A CVD Screening: Done today Cervical Cancer Screening: N/A Colon Cancer Screening: N/A Depression Screening: Done today Diabetes Screening: Done today Glaucoma Screening: See your eye doctor Hepatitis B vaccine: N/A Hepatitis C screening: Up to date HIV Screening: up to date Flu Vaccine: Declined Lung cancer Screening: N/A Obesity Screening: Done today Pneumonia Vaccines (2):Up to date STI Screening: N/A  Follow up plan: Return in about 4 weeks (around 07/19/2023).   LABORATORY TESTING:  - Pap smear: not applicable  IMMUNIZATIONS:   - Tdap: Tetanus vaccination status reviewed: Refused. - Influenza: Refused - Pneumovax: Up to date - Prevnar: Up to date - Zostavax vaccine: Refused  SCREENING: -Mammogram: Not applicable  - Colonoscopy: Not applicable  - Bone Density: Up to date   PATIENT COUNSELING:   Advised to take 1 mg of folate supplement per day if capable of pregnancy.   Sexuality: Discussed sexually transmitted diseases, partner selection, use of condoms, avoidance of unintended pregnancy  and contraceptive alternatives.   Advised to avoid cigarette smoking.  I discussed with the patient that most people either abstain from alcohol or drink within safe limits (<=14/week and <=4 drinks/occasion for males, <=7/weeks and <= 3 drinks/occasion for females) and that the risk for alcohol disorders and other health effects rises proportionally with the number of drinks per week and how often a drinker exceeds daily limits.  Discussed cessation/primary prevention of drug use and availability of treatment for abuse.   Diet: Encouraged to adjust caloric intake to maintain  or achieve ideal body weight, to reduce intake of dietary  saturated fat and total fat, to limit sodium intake by avoiding high sodium foods and not adding  table salt, and to maintain adequate dietary potassium and calcium preferably from fresh fruits, vegetables, and low-fat dairy products.    stressed the importance of regular exercise  Injury prevention: Discussed safety belts, safety helmets, smoke detector, smoking near bedding or upholstery.   Dental health: Discussed importance of regular tooth brushing, flossing, and dental visits.    NEXT PREVENTATIVE PHYSICAL DUE IN 1 YEAR. Return in about 4 weeks (around 07/19/2023).

## 2023-06-21 NOTE — Patient Instructions (Signed)
Preventative Services:  Health Risk Assessment and Personalized Prevention Plan: done today Bone Mass Measurements: up to date Breast Cancer Screening: N/A CVD Screening: Done today Cervical Cancer Screening: N/A Colon Cancer Screening: N/A Depression Screening: Done today Diabetes Screening: Done today Glaucoma Screening: See your eye doctor Hepatitis B vaccine: N/A Hepatitis C screening: Up to date HIV Screening: up to date Flu Vaccine: Declined Lung cancer Screening: N/A Obesity Screening: Done today Pneumonia Vaccines (2):Up to date STI Screening: N/A

## 2023-06-21 NOTE — Assessment & Plan Note (Signed)
Running high. Will increase her losartan to 50mg  and recheck 1 month. Call with any concerns.

## 2023-06-22 LAB — COMPREHENSIVE METABOLIC PANEL
ALT: 12 [IU]/L (ref 0–32)
AST: 23 [IU]/L (ref 0–40)
Albumin: 4.3 g/dL (ref 3.7–4.7)
Alkaline Phosphatase: 73 [IU]/L (ref 44–121)
BUN/Creatinine Ratio: 16 (ref 12–28)
BUN: 15 mg/dL (ref 8–27)
Bilirubin Total: 0.5 mg/dL (ref 0.0–1.2)
CO2: 23 mmol/L (ref 20–29)
Calcium: 9.6 mg/dL (ref 8.7–10.3)
Chloride: 102 mmol/L (ref 96–106)
Creatinine, Ser: 0.96 mg/dL (ref 0.57–1.00)
Globulin, Total: 3.1 g/dL (ref 1.5–4.5)
Glucose: 89 mg/dL (ref 70–99)
Potassium: 4.7 mmol/L (ref 3.5–5.2)
Sodium: 142 mmol/L (ref 134–144)
Total Protein: 7.4 g/dL (ref 6.0–8.5)
eGFR: 59 mL/min/{1.73_m2} — ABNORMAL LOW (ref >59)

## 2023-06-22 LAB — CBC WITH DIFFERENTIAL/PLATELET
Basophils Absolute: 0.1 10*3/uL (ref 0.0–0.2)
Basos: 1 %
EOS (ABSOLUTE): 0.2 10*3/uL (ref 0.0–0.4)
Eos: 3 %
Hematocrit: 44.9 % (ref 34.0–46.6)
Hemoglobin: 14.8 g/dL (ref 11.1–15.9)
Immature Grans (Abs): 0 10*3/uL (ref 0.0–0.1)
Immature Granulocytes: 0 %
Lymphocytes Absolute: 3.8 10*3/uL — ABNORMAL HIGH (ref 0.7–3.1)
Lymphs: 56 %
MCH: 30.7 pg (ref 26.6–33.0)
MCHC: 33 g/dL (ref 31.5–35.7)
MCV: 93 fL (ref 79–97)
Monocytes Absolute: 0.6 10*3/uL (ref 0.1–0.9)
Monocytes: 8 %
Neutrophils Absolute: 2.1 10*3/uL (ref 1.4–7.0)
Neutrophils: 32 %
Platelets: 224 10*3/uL (ref 150–450)
RBC: 4.82 x10E6/uL (ref 3.77–5.28)
RDW: 12.3 % (ref 11.7–15.4)
WBC: 6.7 10*3/uL (ref 3.4–10.8)

## 2023-06-22 LAB — TSH: TSH: 2.73 u[IU]/mL (ref 0.450–4.500)

## 2023-06-22 LAB — LIPID PANEL W/O CHOL/HDL RATIO
Cholesterol, Total: 190 mg/dL (ref 100–199)
HDL: 67 mg/dL (ref >39)
LDL Chol Calc (NIH): 109 mg/dL — ABNORMAL HIGH (ref 0–99)
Triglycerides: 79 mg/dL (ref 0–149)
VLDL Cholesterol Cal: 14 mg/dL (ref 5–40)

## 2023-06-25 ENCOUNTER — Encounter: Payer: Self-pay | Admitting: Family Medicine

## 2023-06-25 NOTE — Progress Notes (Signed)
Printed and mailed on 06/25/2023.

## 2023-06-29 ENCOUNTER — Ambulatory Visit: Payer: PPO

## 2023-07-03 ENCOUNTER — Ambulatory Visit: Payer: PPO

## 2023-07-14 ENCOUNTER — Other Ambulatory Visit: Payer: Self-pay | Admitting: Family Medicine

## 2023-07-17 NOTE — Telephone Encounter (Signed)
 Requested Prescriptions  Pending Prescriptions Disp Refills   traZODone  (DESYREL ) 50 MG tablet [Pharmacy Med Name: TRAZODONE  50 MG TABLET] 90 tablet 0    Sig: TAKE 0.5-1 TABLETS BY MOUTH AT BEDTIME AS NEEDED FOR SLEEP.     Psychiatry: Antidepressants - Serotonin Modulator Passed - 07/17/2023  8:52 AM      Passed - Valid encounter within last 6 months    Recent Outpatient Visits           3 weeks ago Encounter for [employer] annual wellness exam   [place] [place], Megan P, DO   6 months ago Primary hypertension   [place] [facility] [place], Megan P, DO   1 year ago Encounter for [employer] annual wellness exam   [place] [facility] [place], Duwaine SQUIBB, DO   1 year ago Palpitation   [place] [facility] Vigg, Avanti, MD   2 years ago Suspected COVID-19 virus infection   [place] [facility] Vigg, Kennedy, MD       Future Appointments             In 3 days Vicci Duwaine SQUIBB, DO [place] [facility]   In 4 months Hester Alm BROCKS, MD [facility]

## 2023-07-20 ENCOUNTER — Encounter: Payer: Self-pay | Admitting: Family Medicine

## 2023-07-20 ENCOUNTER — Ambulatory Visit (INDEPENDENT_AMBULATORY_CARE_PROVIDER_SITE_OTHER): Payer: PPO | Admitting: Family Medicine

## 2023-07-20 VITALS — BP 126/72 | HR 79 | Temp 98.2°F | Wt 152.0 lb

## 2023-07-20 DIAGNOSIS — I10 Essential (primary) hypertension: Secondary | ICD-10-CM

## 2023-07-20 DIAGNOSIS — F419 Anxiety disorder, unspecified: Secondary | ICD-10-CM | POA: Diagnosis not present

## 2023-07-20 DIAGNOSIS — H6123 Impacted cerumen, bilateral: Secondary | ICD-10-CM | POA: Diagnosis not present

## 2023-07-20 MED ORDER — LOSARTAN POTASSIUM 50 MG PO TABS: 50.0000 mg | ORAL_TABLET | Freq: Every day | ORAL | 1 refills | Status: DC

## 2023-07-20 MED ORDER — ALPRAZOLAM 0.25 MG PO TABS: 0.2500 mg | ORAL_TABLET | Freq: Every day | ORAL | 0 refills | Status: DC | PRN

## 2023-07-20 NOTE — Assessment & Plan Note (Signed)
Under good control on current regimen. Continue current regimen. Continue to monitor. Call with any concerns. Refills given. Labs drawn today.   

## 2023-07-20 NOTE — Progress Notes (Signed)
BP 126/72   Pulse 79   Temp 98.2 F (36.8 C) (Oral)   Wt 152 lb (68.9 kg)   SpO2 96%   BMI 27.80 kg/m    Subjective:    Patient ID: Brianna Stevens, female    DOB: 1940/08/24, 83 y.o.   MRN: [id number]  HPI: Brianna Stevens is a 83 y.o. female  Chief Complaint  Patient presents with   Hypertension   HYPERTENSION  Hypertension status: better  Satisfied with current treatment? yes Duration of hypertension: chronic BP monitoring frequency:  rarely BP medication side effects:  no Medication compliance: excellent compliance Previous BP meds: losartan Aspirin: yes Recurrent headaches: no Visual changes: no Palpitations: no Dyspnea: no Chest pain: no Lower extremity edema: no Dizzy/lightheaded: no  ANXIETY/STRESS- just recently found out that her son is ill. She is very anxious about him and she is very anxious  Duration: few weeks Statusexacerbated Anxious mood: yes  Excessive worrying: yes Irritability: no  Sweating: no Nausea: no Palpitations:no Hyperventilation: no Panic attacks: no Agoraphobia: no  Obscessions/compulsions: no Depressed mood: no    07/20/2023    9:04 AM 06/21/2023   10:31 AM 12/20/2022    9:36 AM 06/13/2022    2:47 PM 12/06/2021   10:19 AM  Depression screen PHQ 2/9  Decreased Interest 0 0 0 0 0  Down, Depressed, Hopeless 0 0 0 0 0  PHQ - 2 Score 0 0 0 0 0  Altered sleeping 2 3 2 1 1   Tired, decreased energy 0 0 0 0 0  Change in appetite 0 0 0 0 0  Feeling bad or failure about yourself  0 0 0 0 0  Trouble concentrating 0 0 0 0 0  Moving slowly or fidgety/restless 0 0 0 0 0  Suicidal thoughts 0 0 0 0 0  PHQ-9 Score 2 3 2 1 1   Difficult doing work/chores Not difficult at all Not difficult at all Not difficult at all Not difficult at all Not difficult at all      07/20/2023    9:04 AM 06/21/2023   10:32 AM 12/20/2022    9:36 AM 06/13/2022    2:47 PM  GAD 7 : Generalized Anxiety Score  Nervous, Anxious, on Edge 0 0 0 0   Control/stop worrying 2 0 0 0  Worry too much - different things 1 0 0 1  Trouble relaxing 1 0 0 0  Restless 0 0 0 0  Easily annoyed or irritable 0 0 0 0  Afraid - awful might happen 0 0 0 0  Total GAD 7 Score 4 0 0 1  Anxiety Difficulty Not difficult at all Not difficult at all Not difficult at all Not difficult at all   Anhedonia: no Weight changes: no Insomnia: yes   Hypersomnia: no Fatigue/loss of energy: no Feelings of worthlessness: no Feelings of guilt: no Impaired concentration/indecisiveness: no Suicidal ideations: no  Crying spells: yes Recent Stressors/Life Changes: yes   Relationship problems: no   Family stress: yes     Financial stress: no    Job stress: no    Recent death/loss: no   Relevant past medical, surgical, family and social history reviewed and updated as indicated. Interim medical history since our last visit reviewed. Allergies and medications reviewed and updated.  Review of Systems  Constitutional: Negative.   Respiratory: Negative.    Cardiovascular: Negative.   Gastrointestinal: Negative.   Musculoskeletal: Negative.   Psychiatric/Behavioral:  Negative for agitation, behavioral problems,  confusion, decreased concentration, dysphoric mood, hallucinations, self-injury, sleep disturbance and suicidal ideas. The patient is nervous/anxious. The patient is not hyperactive.     Per HPI unless specifically indicated above     Objective:    BP 126/72   Pulse 79   Temp 98.2 F (36.8 C) (Oral)   Wt 152 lb (68.9 kg)   SpO2 96%   BMI 27.80 kg/m   Wt Readings from Last 3 Encounters:  07/20/23 152 lb (68.9 kg)  06/21/23 150 lb 9.6 oz (68.3 kg)  12/20/22 149 lb 3.2 oz (67.7 kg)    Physical Exam Vitals and nursing note reviewed.  Constitutional:      General: She is not in acute distress.    Appearance: Normal appearance. She is not ill-appearing, toxic-appearing or diaphoretic.  HENT:     Head: Normocephalic and atraumatic.     Right  Ear: External ear normal.     Left Ear: External ear normal.     Nose: Nose normal.     Mouth/Throat:     Mouth: Mucous membranes are moist.     Pharynx: Oropharynx is clear.  Eyes:     General: No scleral icterus.       Right eye: No discharge.        Left eye: No discharge.     Extraocular Movements: Extraocular movements intact.     Conjunctiva/sclera: Conjunctivae normal.     Pupils: Pupils are equal, round, and reactive to light.  Cardiovascular:     Rate and Rhythm: Normal rate and regular rhythm.     Pulses: Normal pulses.     Heart sounds: Normal heart sounds. No murmur heard.    No friction rub. No gallop.  Pulmonary:     Effort: Pulmonary effort is normal. No respiratory distress.     Breath sounds: Normal breath sounds. No stridor. No wheezing, rhonchi or rales.  Chest:     Chest wall: No tenderness.  Musculoskeletal:        General: Normal range of motion.     Cervical back: Normal range of motion and neck supple.  Skin:    General: Skin is warm and dry.     Capillary Refill: Capillary refill takes less than 2 seconds.     Coloration: Skin is not jaundiced or pale.     Findings: No bruising, erythema, lesion or rash.  Neurological:     General: No focal deficit present.     Mental Status: She is alert and oriented to person, place, and time. Mental status is at baseline.  Psychiatric:        Mood and Affect: Mood normal.        Behavior: Behavior normal.        Thought Content: Thought content normal.        Judgment: Judgment normal.     Results for orders placed or performed in visit on 06/21/23  Bayer DCA Hb A1c Waived   Collection Time: 06/21/23 10:33 AM  Result Value Ref Range   HB A1C (BAYER DCA - WAIVED) 5.7 (H) 4.8 - 5.6 %  Microalbumin, Urine Waived   Collection Time: 06/21/23 10:33 AM  Result Value Ref Range   Microalb, Ur Waived 80 (H) 0 - 19 mg/L   Creatinine, Urine Waived 100 10 - 300 mg/dL   Microalb/Creat Ratio 30-300 (H) <30 mg/g  CBC  with Differential/Platelet   Collection Time: 06/21/23 10:34 AM  Result Value Ref Range   WBC 6.7 3.4 -  10.8 x10E3/uL   RBC 4.82 3.77 - 5.28 x10E6/uL   Hemoglobin 14.8 11.1 - 15.9 g/dL   Hematocrit 62.1 30.8 - 46.6 %   MCV 93 79 - 97 fL   MCH 30.7 26.6 - 33.0 pg   MCHC 33.0 31.5 - 35.7 g/dL   RDW 65.7 84.6 - 96.2 %   Platelets 224 150 - 450 x10E3/uL   Neutrophils 32 Not Estab. %   Lymphs 56 Not Estab. %   Monocytes 8 Not Estab. %   Eos 3 Not Estab. %   Basos 1 Not Estab. %   Neutrophils Absolute 2.1 1.4 - 7.0 x10E3/uL   Lymphocytes Absolute 3.8 (H) 0.7 - 3.1 x10E3/uL   Monocytes Absolute 0.6 0.1 - 0.9 x10E3/uL   EOS (ABSOLUTE) 0.2 0.0 - 0.4 x10E3/uL   Basophils Absolute 0.1 0.0 - 0.2 x10E3/uL   Immature Granulocytes 0 Not Estab. %   Immature Grans (Abs) 0.0 0.0 - 0.1 x10E3/uL  Comprehensive metabolic panel   Collection Time: 06/21/23 10:34 AM  Result Value Ref Range   Glucose 89 70 - 99 mg/dL   BUN 15 8 - 27 mg/dL   Creatinine, Ser 9.52 0.57 - 1.00 mg/dL   eGFR 59 (L) >84 XL/KGM/0.10   BUN/Creatinine Ratio 16 12 - 28   Sodium 142 134 - 144 mmol/L   Potassium 4.7 3.5 - 5.2 mmol/L   Chloride 102 96 - 106 mmol/L   CO2 23 20 - 29 mmol/L   Calcium 9.6 8.7 - 10.3 mg/dL   Total Protein 7.4 6.0 - 8.5 g/dL   Albumin 4.3 3.7 - 4.7 g/dL   Globulin, Total 3.1 1.5 - 4.5 g/dL   Bilirubin Total 0.5 0.0 - 1.2 mg/dL   Alkaline Phosphatase 73 44 - 121 IU/L   AST 23 0 - 40 IU/L   ALT 12 0 - 32 IU/L  Lipid Panel w/o Chol/HDL Ratio   Collection Time: 06/21/23 10:34 AM  Result Value Ref Range   Cholesterol, Total 190 100 - 199 mg/dL   Triglycerides 79 0 - 149 mg/dL   HDL 67 >27 mg/dL   VLDL Cholesterol Cal 14 5 - 40 mg/dL   LDL Chol Calc (NIH) 253 (H) 0 - 99 mg/dL  TSH   Collection Time: 06/21/23 10:34 AM  Result Value Ref Range   TSH 2.730 0.450 - 4.500 uIU/mL      Assessment & Plan:   Problem List Items Addressed This Visit       Cardiovascular and Mediastinum   HTN  (hypertension) - Primary   Under good control on current regimen. Continue current regimen. Continue to monitor. Call with any concerns. Refills given.  Labs drawn today.       Relevant Medications   losartan (COZAAR) 50 MG tablet   Other Relevant Orders   Basic metabolic panel     Nervous and Auditory   Bilateral hearing loss due to cerumen impaction   Other Visit Diagnoses       Acute anxiety       Due to her son's illness. Short course of alprazolam given to patient. Call with any concerns.   Relevant Medications   ALPRAZolam (XANAX) 0.25 MG tablet        Follow up plan: Return in about 5 months (around 12/18/2023).

## 2023-07-21 LAB — BASIC METABOLIC PANEL
BUN/Creatinine Ratio: 21 (ref 12–28)
BUN: 22 mg/dL (ref 8–27)
CO2: 25 mmol/L (ref 20–29)
Calcium: 9.9 mg/dL (ref 8.7–10.3)
Chloride: 101 mmol/L (ref 96–106)
Creatinine, Ser: 1.04 mg/dL — ABNORMAL HIGH (ref 0.57–1.00)
Glucose: 99 mg/dL (ref 70–99)
Potassium: 4.8 mmol/L (ref 3.5–5.2)
Sodium: 141 mmol/L (ref 134–144)
eGFR: 53 mL/min/{1.73_m2} — ABNORMAL LOW (ref >59)

## 2023-07-22 ENCOUNTER — Encounter: Payer: Self-pay | Admitting: Family Medicine

## 2023-07-26 NOTE — Progress Notes (Signed)
Letter printed and mailed.  

## 2023-08-15 ENCOUNTER — Other Ambulatory Visit: Payer: Self-pay | Admitting: Family Medicine

## 2023-08-15 DIAGNOSIS — E7800 Pure hypercholesterolemia, unspecified: Secondary | ICD-10-CM

## 2023-08-15 NOTE — Telephone Encounter (Signed)
D/C12/19/24. Requested Prescriptions  Refused Prescriptions Disp Refills   losartan (COZAAR) 25 MG tablet [Pharmacy Med Name: LOSARTAN POTASSIUM 25 MG TAB] 90 tablet 1    Sig: TAKE 1 TABLET (25 MG TOTAL) BY MOUTH DAILY.     Cardiovascular:  Angiotensin Receptor Blockers Failed - 08/15/2023  3:17 PM      Failed - Cr in normal range and within 180 days    Creatinine, Ser  Date Value Ref Range Status  07/20/2023 1.04 (H) 0.57 - 1.00 mg/dL Final         Passed - K in normal range and within 180 days    Potassium  Date Value Ref Range Status  07/20/2023 4.8 3.5 - 5.2 mmol/L Final         Passed - Patient is not pregnant      Passed - Last BP in normal range    BP Readings from Last 1 Encounters:  07/20/23 126/72         Passed - Valid encounter within last 6 months    Recent Outpatient Visits           3 weeks ago Primary hypertension   [place] [facility] [place], Megan P, DO   1 month ago Encounter for [employer] annual wellness exam   [place] [facility] [place], Megan P, DO   7 months ago Primary hypertension   [place] [facility] [place], [place] Sin Miedo, DO   1 year ago Encounter for [employer] annual wellness exam   [place] [facility] [place], Oralia Rud, DO   1 year ago Palpitation   [facility] Loura Pardon, MD       Future Appointments             In 3 months Deirdre Evener, MD [facility]   In 4 months Laural Benes, Oralia Rud, DO [place] [facility]

## 2023-10-10 ENCOUNTER — Other Ambulatory Visit: Payer: Self-pay | Admitting: Family Medicine

## 2023-10-11 NOTE — Telephone Encounter (Signed)
 Unable to refill per protocol, Rx expired. Discontinued 07/20/23.  Requested Prescriptions  Pending Prescriptions Disp Refills   traZODone (DESYREL) 50 MG tablet [Pharmacy Med Name: TRAZODONE 50 MG TABLET] 90 tablet 0    Sig: TAKE 1/2 TO 1 TABLET BY MOUTH AT BEDTIME AS NEEDED FOR SLEEP     Psychiatry: Antidepressants - Serotonin Modulator Failed - 10/11/2023  9:36 AM      Failed - Valid encounter within last 6 months    Recent Outpatient Visits   None     Future Appointments             In 1 month Deirdre Evener, MD [facility]   In 2 months Dorcas Carrow, DO [facility]

## 2023-11-14 ENCOUNTER — Ambulatory Visit: Payer: PPO | Admitting: Dermatology

## 2023-11-14 ENCOUNTER — Encounter: Payer: Self-pay | Admitting: Dermatology

## 2023-11-14 DIAGNOSIS — W908XXA Exposure to other nonionizing radiation, initial encounter: Secondary | ICD-10-CM

## 2023-11-14 DIAGNOSIS — L820 Inflamed seborrheic keratosis: Secondary | ICD-10-CM

## 2023-11-14 DIAGNOSIS — L821 Other seborrheic keratosis: Secondary | ICD-10-CM | POA: Diagnosis not present

## 2023-11-14 DIAGNOSIS — L570 Actinic keratosis: Secondary | ICD-10-CM | POA: Diagnosis not present

## 2023-11-14 DIAGNOSIS — Z872 Personal history of diseases of the skin and subcutaneous tissue: Secondary | ICD-10-CM | POA: Diagnosis not present

## 2023-11-14 DIAGNOSIS — Z1283 Encounter for screening for malignant neoplasm of skin: Secondary | ICD-10-CM | POA: Diagnosis not present

## 2023-11-14 DIAGNOSIS — L814 Other melanin hyperpigmentation: Secondary | ICD-10-CM | POA: Diagnosis not present

## 2023-11-14 DIAGNOSIS — D229 Melanocytic nevi, unspecified: Secondary | ICD-10-CM

## 2023-11-14 DIAGNOSIS — L578 Other skin changes due to chronic exposure to nonionizing radiation: Secondary | ICD-10-CM

## 2023-11-14 DIAGNOSIS — D1801 Hemangioma of skin and subcutaneous tissue: Secondary | ICD-10-CM

## 2023-11-14 NOTE — Progress Notes (Unsigned)
 Follow-Up Visit   Subjective  Brianna Stevens is a 83 y.o. female who presents for the following: Skin Cancer Screening and Full Body Skin Exam. Hx of AKs. No personal Hx of skin cancer or dysplastic nevi.   Areas of concern on nose and forehead. Pink, scaly.   The patient presents for Total-Body Skin Exam (TBSE) for skin cancer screening and mole check. The patient has spots, moles and lesions to be evaluated, some may be new or changing and the patient may have concern these could be cancer.  The following portions of the chart were reviewed this encounter and updated as appropriate: medications, allergies, medical history  Review of Systems:  No other skin or systemic complaints except as noted in HPI or Assessment and Plan.  Objective  Well appearing patient in no apparent distress; mood and affect are within normal limits.  A full examination was performed including scalp, head, eyes, ears, nose, lips, neck, chest, axillae, abdomen, back, buttocks, bilateral upper extremities, bilateral lower extremities, hands, feet, fingers, toes, fingernails, and toenails. All findings within normal limits unless otherwise noted below.   Relevant physical exam findings are noted in the Assessment and Plan.  Exam of nails limited by presence of nail polish.   Left Forehead x1,  above L Eyebrow x1, R hand dorsum x1, R mid pretibial x2, L scapula x1, L mid back x1, R inframammary x1, R axilla x1 (9) Erythematous keratotic or waxy stuck-on papule or plaque. Nose x3, R temple x3, R cutaneous upper lip x1 (7) Erythematous thin papules/macules with gritty scale.   Assessment & Plan   SKIN CANCER SCREENING PERFORMED TODAY.  ACTINIC DAMAGE - Chronic condition, secondary to cumulative UV/sun exposure - diffuse scaly erythematous macules with underlying dyspigmentation - Recommend daily broad spectrum sunscreen SPF 30+ to sun-exposed areas, reapply every 2 hours as needed.  - Staying in the shade  or wearing long sleeves, sun glasses (UVA+UVB protection) and wide brim hats (4-inch brim around the entire circumference of the hat) are also recommended for sun protection.  - Call for new or changing lesions.  LENTIGINES, SEBORRHEIC KERATOSES, HEMANGIOMAS - Benign normal skin lesions - Benign-appearing - Call for any changes  MELANOCYTIC NEVI - Tan-brown and/or pink-flesh-colored symmetric macules and papules - Benign appearing on exam today - Observation - Call clinic for new or changing moles - Recommend daily use of broad spectrum spf 30+ sunscreen to sun-exposed areas.  - Check nails when remove polish.   INFLAMED SEBORRHEIC KERATOSIS (9) Left Forehead x1,  above L Eyebrow x1, R hand dorsum x1, R mid pretibial x2, L scapula x1, L mid back x1, R inframammary x1, R axilla x1 (9) Symptomatic, irritating, patient would like treated. Destruction of lesion - Left Forehead x1,  above L Eyebrow x1, R hand dorsum x1, R mid pretibial x2, L scapula x1, L mid back x1, R inframammary x1, R axilla x1 (9) Complexity: simple   Destruction method: cryotherapy   Informed consent: discussed and consent obtained   Timeout:  patient name, date of birth, surgical site, and procedure verified Lesion destroyed using liquid nitrogen: Yes   Region frozen until ice ball extended beyond lesion: Yes   Outcome: patient tolerated procedure well with no complications   Post-procedure details: wound care instructions given   Additional details:  Prior to procedure, discussed risks of blister formation, small wound, skin dyspigmentation, or rare scar following cryotherapy. Recommend Vaseline ointment to treated areas while healing.  AK (ACTINIC KERATOSIS) (7)  Nose x3, R temple x3, R cutaneous upper lip x1 (7) Actinic keratoses are precancerous spots that appear secondary to cumulative UV radiation exposure/sun exposure over time. They are chronic with expected duration over 1 year. A portion of actinic keratoses  will progress to squamous cell carcinoma of the skin. It is not possible to reliably predict which spots will progress to skin cancer and so treatment is recommended to prevent development of skin cancer.  Recommend daily broad spectrum sunscreen SPF 30+ to sun-exposed areas, reapply every 2 hours as needed.  Recommend staying in the shade or wearing long sleeves, sun glasses (UVA+UVB protection) and wide brim hats (4-inch brim around the entire circumference of the hat). Call for new or changing lesions. Destruction of lesion - Nose x3, R temple x3, R cutaneous upper lip x1 (7) Complexity: simple   Destruction method: cryotherapy   Informed consent: discussed and consent obtained   Timeout:  patient name, date of birth, surgical site, and procedure verified Lesion destroyed using liquid nitrogen: Yes   Region frozen until ice ball extended beyond lesion: Yes   Outcome: patient tolerated procedure well with no complications   Post-procedure details: wound care instructions given   Additional details:  Prior to procedure, discussed risks of blister formation, small wound, skin dyspigmentation, or rare scar following cryotherapy. Recommend Vaseline ointment to treated areas while healing.  Return in about 1 year (around 11/13/2024) for TBSE, HxAK.  I, Jill Parcell, CMA, am acting as scribe for Celine Collard, MD.   Documentation: I have reviewed the above documentation for accuracy and completeness, and I agree with the above.  Celine Collard, MD

## 2023-11-14 NOTE — Patient Instructions (Addendum)

## 2023-11-15 ENCOUNTER — Encounter: Payer: Self-pay | Admitting: Dermatology

## 2023-12-06 ENCOUNTER — Other Ambulatory Visit: Payer: Self-pay | Admitting: Family Medicine

## 2023-12-06 DIAGNOSIS — E7800 Pure hypercholesterolemia, unspecified: Secondary | ICD-10-CM

## 2023-12-07 NOTE — Telephone Encounter (Signed)
 Last OV 07/20/23 within protocol.  Requested Prescriptions  Pending Prescriptions Disp Refills   simvastatin  (ZOCOR ) 20 MG tablet [Pharmacy Med Name: SIMVASTATIN  20 MG TABLET] 90 tablet 0    Sig: TAKE 1 TABLET BY MOUTH EVERY DAY     Cardiovascular:  Antilipid - Statins Failed - 12/07/2023  9:34 AM      Failed - Valid encounter within last 12 months    Recent Outpatient Visits   None     Future Appointments             In 2 weeks Solomon Dupre, DO [place] [facility]   In 11 months Elta Halter, MD [place] [facility]            Failed - Lipid Panel in normal range within the last 12 months    Cholesterol, Total  Date Value Ref Range Status  06/21/2023 190 100 - 199 mg/dL Final   Cholesterol Piccolo, Waived  Date Value Ref Range Status  12/17/2017 172 <200 mg/dL Final    Comment:                            Desirable                <200                         Borderline High      200- 239                         High                     >239    LDL Chol Calc (NIH)  Date Value Ref Range Status  06/21/2023 109 (H) 0 - 99 mg/dL Final   HDL  Date Value Ref Range Status  06/21/2023 67 >39 mg/dL Final   Triglycerides  Date Value Ref Range Status  06/21/2023 79 0 - 149 mg/dL Final   Triglycerides Piccolo,Waived  Date Value Ref Range Status  12/17/2017 72 <150 mg/dL Final    Comment:                            Normal                   <150                         Borderline High     150 - 199                         High                200 - 499                         Very High                >499          Passed - Patient is not pregnant

## 2023-12-21 ENCOUNTER — Ambulatory Visit (INDEPENDENT_AMBULATORY_CARE_PROVIDER_SITE_OTHER): Payer: Self-pay | Admitting: Family Medicine

## 2023-12-21 ENCOUNTER — Encounter: Payer: Self-pay | Admitting: Family Medicine

## 2023-12-21 VITALS — BP 118/72 | HR 66 | Temp 98.0°F | Ht 63.0 in | Wt 150.4 lb

## 2023-12-21 DIAGNOSIS — H6121 Impacted cerumen, right ear: Secondary | ICD-10-CM | POA: Diagnosis not present

## 2023-12-21 DIAGNOSIS — R7301 Impaired fasting glucose: Secondary | ICD-10-CM

## 2023-12-21 DIAGNOSIS — E7800 Pure hypercholesterolemia, unspecified: Secondary | ICD-10-CM | POA: Diagnosis not present

## 2023-12-21 DIAGNOSIS — I10 Essential (primary) hypertension: Secondary | ICD-10-CM | POA: Diagnosis not present

## 2023-12-21 LAB — BAYER DCA HB A1C WAIVED: HB A1C (BAYER DCA - WAIVED): 5.9 % — ABNORMAL HIGH (ref 4.8–5.6)

## 2023-12-21 MED ORDER — ALPRAZOLAM 0.25 MG PO TABS: 0.2500 mg | ORAL_TABLET | Freq: Every day | ORAL | 0 refills | Status: AC | PRN

## 2023-12-21 MED ORDER — SIMVASTATIN 20 MG PO TABS: 20.0000 mg | ORAL_TABLET | Freq: Every day | ORAL | 1 refills | Status: DC

## 2023-12-21 MED ORDER — LOSARTAN POTASSIUM 50 MG PO TABS: 50.0000 mg | ORAL_TABLET | Freq: Every day | ORAL | 1 refills | Status: DC

## 2023-12-21 MED ORDER — OMEPRAZOLE 20 MG PO CPDR: 20.0000 mg | DELAYED_RELEASE_CAPSULE | Freq: Two times a day (BID) | ORAL | 1 refills | Status: DC

## 2023-12-21 NOTE — Assessment & Plan Note (Signed)
 Rechecking labs today. Await results. Treat as needed.

## 2023-12-21 NOTE — Progress Notes (Signed)
 BP 118/72   Pulse 66   Temp 98 F (36.7 C) (Oral)   Ht 5' 3 (1.6 m)   Wt 150 lb 6.4 oz (68.2 kg)   SpO2 97%   BMI 26.64 kg/m    Subjective:    Patient ID: Brianna Stevens, female    DOB: 11/08/1940, 83 y.o.   MRN: [id number]  HPI: Brianna Stevens is a 83 y.o. female  Chief Complaint  Patient presents with   Hypertension   Anxiety   HYPERTENSION / HYPERLIPIDEMIA Satisfied with current treatment? yes Duration of hypertension: chronic BP monitoring frequency: not checking BP medication side effects: no Past BP meds: losartan  Duration of hyperlipidemia: chronic Cholesterol medication side effects: no Cholesterol supplements: none Past cholesterol medications: simvastatin  Medication compliance: excellent compliance Aspirin: yes Recent stressors: no Recurrent headaches: no Visual changes: no Palpitations: no Dyspnea: no Chest pain: no Lower extremity edema: no Dizzy/lightheaded: no  ANXIETY/STRESS- son is sick, dealing with it OK but still quite stressed Duration: chronic Status:stable Anxious mood: yes  Excessive worrying: no Irritability: no  Sweating: no Nausea: no Palpitations:no Hyperventilation: no Panic attacks: no Agoraphobia: no  Obscessions/compulsions: no Depressed mood: no    12/21/2023    9:27 AM 07/20/2023    9:04 AM 06/21/2023   10:31 AM 12/20/2022    9:36 AM 06/13/2022    2:47 PM  Depression screen PHQ 2/9  Decreased Interest 0 0 0 0 0  Down, Depressed, Hopeless 0 0 0 0 0  PHQ - 2 Score 0 0 0 0 0  Altered sleeping 1 2 3 2 1   Tired, decreased energy 0 0 0 0 0  Change in appetite 0 0 0 0 0  Feeling bad or failure about yourself  0 0 0 0 0  Trouble concentrating 0 0 0 0 0  Moving slowly or fidgety/restless 0 0 0 0 0  Suicidal thoughts 0 0 0 0 0  PHQ-9 Score 1 2 3 2 1   Difficult doing work/chores  Not difficult at all Not difficult at all Not difficult at all Not difficult at all      12/21/2023    9:27 AM 07/20/2023    9:04 AM  06/21/2023   10:32 AM 12/20/2022    9:36 AM  GAD 7 : Generalized Anxiety Score  Nervous, Anxious, on Edge 0 0 0 0  Control/stop worrying 1 2 0 0  Worry too much - different things 1 1 0 0  Trouble relaxing 0 1 0 0  Restless 0 0 0 0  Easily annoyed or irritable 0 0 0 0  Afraid - awful might happen 0 0 0 0  Total GAD 7 Score 2 4 0 0  Anxiety Difficulty  Not difficult at all Not difficult at all Not difficult at all   Anhedonia: no Weight changes: no Insomnia: no   Hypersomnia: no Fatigue/loss of energy: no Feelings of worthlessness: no Feelings of guilt: no Impaired concentration/indecisiveness: no Suicidal ideations: no  Crying spells: no Recent Stressors/Life Changes: no   Relationship problems: no   Family stress: no     Financial stress: no    Job stress: no    Recent death/loss: no  Relevant past medical, surgical, family and social history reviewed and updated as indicated. Interim medical history since our last visit reviewed. Allergies and medications reviewed and updated.  Review of Systems  Constitutional: Negative.   HENT:  Positive for ear pain. Negative for congestion, dental problem, drooling, ear discharge,  facial swelling, hearing loss, mouth sores, nosebleeds, postnasal drip, rhinorrhea, sinus pressure, sinus pain, sneezing, sore throat, tinnitus, trouble swallowing and voice change.   Respiratory: Negative.    Cardiovascular: Negative.   Neurological: Negative.   Psychiatric/Behavioral: Negative.      Per HPI unless specifically indicated above     Objective:    BP 118/72   Pulse 66   Temp 98 F (36.7 C) (Oral)   Ht 5' 3 (1.6 m)   Wt 150 lb 6.4 oz (68.2 kg)   SpO2 97%   BMI 26.64 kg/m   Wt Readings from Last 3 Encounters:  12/21/23 150 lb 6.4 oz (68.2 kg)  07/20/23 152 lb (68.9 kg)  06/21/23 150 lb 9.6 oz (68.3 kg)    Physical Exam Vitals and nursing note reviewed.  Constitutional:      General: She is not in acute distress.     Appearance: Normal appearance. She is not ill-appearing, toxic-appearing or diaphoretic.  HENT:     Head: Normocephalic and atraumatic.     Right Ear: External ear normal. There is impacted cerumen.     Left Ear: External ear normal. There is no impacted cerumen.     Nose: Nose normal.     Mouth/Throat:     Mouth: Mucous membranes are moist.     Pharynx: Oropharynx is clear.   Eyes:     General: No scleral icterus.       Right eye: No discharge.        Left eye: No discharge.     Extraocular Movements: Extraocular movements intact.     Conjunctiva/sclera: Conjunctivae normal.     Pupils: Pupils are equal, round, and reactive to light.    Cardiovascular:     Rate and Rhythm: Normal rate and regular rhythm.     Pulses: Normal pulses.     Heart sounds: Murmur heard.     No friction rub. No gallop.  Pulmonary:     Effort: Pulmonary effort is normal. No respiratory distress.     Breath sounds: Normal breath sounds. No stridor. No wheezing, rhonchi or rales.  Chest:     Chest wall: No tenderness.   Musculoskeletal:        General: Normal range of motion.     Cervical back: Normal range of motion and neck supple.   Skin:    General: Skin is warm and dry.     Capillary Refill: Capillary refill takes less than 2 seconds.     Coloration: Skin is not jaundiced or pale.     Findings: No bruising, erythema, lesion or rash.   Neurological:     General: No focal deficit present.     Mental Status: She is alert and oriented to person, place, and time. Mental status is at baseline.   Psychiatric:        Mood and Affect: Mood normal.        Behavior: Behavior normal.        Thought Content: Thought content normal.        Judgment: Judgment normal.     Results for orders placed or performed in visit on 07/20/23  Basic metabolic panel   Collection Time: 07/20/23  8:55 AM  Result Value Ref Range   Glucose 99 70 - 99 mg/dL   BUN 22 8 - 27 mg/dL   Creatinine, Ser 1.61 (H) 0.57 -  1.00 mg/dL   eGFR 53 (L) >09 UE/AVW/0.98   BUN/Creatinine Ratio 21 12 -  28   Sodium 141 134 - 144 mmol/L   Potassium 4.8 3.5 - 5.2 mmol/L   Chloride 101 96 - 106 mmol/L   CO2 25 20 - 29 mmol/L   Calcium  9.9 8.7 - 10.3 mg/dL      Assessment & Plan:   Problem List Items Addressed This Visit       Cardiovascular and Mediastinum   HTN (hypertension) - Primary   Under good control on current regimen. Continue current regimen. Continue to monitor. Call with any concerns. Refills given. Labs drawn today.      Relevant Medications   losartan  (COZAAR ) 50 MG tablet   simvastatin  (ZOCOR ) 20 MG tablet   Other Relevant Orders   CBC with Differential/Platelet   Comprehensive metabolic panel with GFR     Endocrine   IFG (impaired fasting glucose)   Rechecking labs today. Await results. Treat as needed.       Relevant Orders   CBC with Differential/Platelet   Comprehensive metabolic panel with GFR   Bayer DCA Hb A1c Waived     Other   Hypercholesteremia   Under good control on current regimen. Continue current regimen. Continue to monitor. Call with any concerns. Refills given. Labs drawn today.       Relevant Medications   losartan  (COZAAR ) 50 MG tablet   simvastatin  (ZOCOR ) 20 MG tablet   Other Relevant Orders   CBC with Differential/Platelet   Lipid Panel w/o Chol/HDL Ratio   Comprehensive metabolic panel with GFR   Other Visit Diagnoses       Hearing loss of right ear due to cerumen impaction       Ear flushed today with good results.        Follow up plan: Return in about 6 months (around 06/21/2024) for physical.

## 2023-12-21 NOTE — Assessment & Plan Note (Signed)
 Under good control on current regimen. Continue current regimen. Continue to monitor. Call with any concerns. Refills given. Labs drawn today.

## 2023-12-22 LAB — COMPREHENSIVE METABOLIC PANEL WITH GFR
ALT: 12 IU/L (ref 0–32)
AST: 21 IU/L (ref 0–40)
Albumin: 4.4 g/dL (ref 3.7–4.7)
Alkaline Phosphatase: 80 IU/L (ref 44–121)
BUN/Creatinine Ratio: 16 (ref 12–28)
BUN: 16 mg/dL (ref 8–27)
Bilirubin Total: 0.4 mg/dL (ref 0.0–1.2)
CO2: 24 mmol/L (ref 20–29)
Calcium: 10.1 mg/dL (ref 8.7–10.3)
Chloride: 101 mmol/L (ref 96–106)
Creatinine, Ser: 0.97 mg/dL (ref 0.57–1.00)
Globulin, Total: 2.9 g/dL (ref 1.5–4.5)
Glucose: 94 mg/dL (ref 70–99)
Potassium: 4.7 mmol/L (ref 3.5–5.2)
Sodium: 142 mmol/L (ref 134–144)
Total Protein: 7.3 g/dL (ref 6.0–8.5)
eGFR: 58 mL/min/{1.73_m2} — ABNORMAL LOW (ref >59)

## 2023-12-22 LAB — CBC WITH DIFFERENTIAL/PLATELET
Basophils Absolute: 0 10*3/uL (ref 0.0–0.2)
Basos: 1 %
EOS (ABSOLUTE): 0.2 10*3/uL (ref 0.0–0.4)
Eos: 3 %
Hematocrit: 46.8 % — ABNORMAL HIGH (ref 34.0–46.6)
Hemoglobin: 15.4 g/dL (ref 11.1–15.9)
Immature Grans (Abs): 0 10*3/uL (ref 0.0–0.1)
Immature Granulocytes: 0 %
Lymphocytes Absolute: 3.6 10*3/uL — ABNORMAL HIGH (ref 0.7–3.1)
Lymphs: 55 %
MCH: 30.8 pg (ref 26.6–33.0)
MCHC: 32.9 g/dL (ref 31.5–35.7)
MCV: 94 fL (ref 79–97)
Monocytes Absolute: 0.5 10*3/uL (ref 0.1–0.9)
Monocytes: 8 %
Neutrophils Absolute: 2.1 10*3/uL (ref 1.4–7.0)
Neutrophils: 33 %
Platelets: 238 10*3/uL (ref 150–450)
RBC: 5 x10E6/uL (ref 3.77–5.28)
RDW: 11.8 % (ref 11.7–15.4)
WBC: 6.5 10*3/uL (ref 3.4–10.8)

## 2023-12-22 LAB — LIPID PANEL W/O CHOL/HDL RATIO
Cholesterol, Total: 195 mg/dL (ref 100–199)
HDL: 57 mg/dL (ref >39)
LDL Chol Calc (NIH): 120 mg/dL — ABNORMAL HIGH (ref 0–99)
Triglycerides: 102 mg/dL (ref 0–149)
VLDL Cholesterol Cal: 18 mg/dL (ref 5–40)

## 2023-12-24 ENCOUNTER — Ambulatory Visit: Payer: Self-pay | Admitting: Family Medicine

## 2023-12-24 NOTE — Progress Notes (Signed)
 Letter printed and mailed.

## 2023-12-24 NOTE — Telephone Encounter (Signed)
 Letter printed and mailed.

## 2024-01-23 ENCOUNTER — Other Ambulatory Visit: Payer: Self-pay | Admitting: Family Medicine

## 2024-01-23 DIAGNOSIS — Z1231 Encounter for screening mammogram for malignant neoplasm of breast: Secondary | ICD-10-CM

## 2024-02-08 ENCOUNTER — Ambulatory Visit
Admission: RE | Admit: 2024-02-08 | Discharge: 2024-02-08 | Disposition: A | Discharge status: Home / Self Care | Source: Ambulatory Visit | Attending: Family Medicine | Admitting: Family Medicine

## 2024-02-08 DIAGNOSIS — Z1231 Encounter for screening mammogram for malignant neoplasm of breast: Secondary | ICD-10-CM | POA: Diagnosis not present

## 2024-02-13 ENCOUNTER — Ambulatory Visit: Payer: Self-pay | Admitting: Family Medicine

## 2024-05-14 ENCOUNTER — Ambulatory Visit: Admitting: Dermatology

## 2024-05-14 ENCOUNTER — Encounter: Payer: Self-pay | Admitting: Dermatology

## 2024-05-14 DIAGNOSIS — L309 Dermatitis, unspecified: Secondary | ICD-10-CM | POA: Diagnosis not present

## 2024-05-14 MED ORDER — HYDROCORTISONE 2.5 % EX CREA: TOPICAL_CREAM | Freq: Two times a day (BID) | CUTANEOUS | 0 refills | Status: AC

## 2024-05-14 NOTE — Patient Instructions (Addendum)
 Start Hydrocortisone 2.5% cream twice a day to rash on face up to 2 weeks then stop  For now stop Retinol and Vitamin C Once face is healed may restart Retinol to face nightly, may apply a moisturizer first the Retinol Once face healed may restart Vitamin C to face in the morning Start with one product at a time when restarting Retinol and Vitamin C  Start SPF daily  Basic OTC daily skin care regimen to prevent photoaging:   Recommend facial moisturizer with sunscreen SPF 30 every morning (OTC brands include CeraVe AM, Neutrogena, Eucerin, Cetaphil, Aveeno, La Roche Posay).  Can also apply a topical Vit C serum which is an antioxidant (OTC brands include CeraVe, La Roche Posay, Neutrogena and The Ordinary) underneath sunscreen in morning. If you are outside during the day in the summer for extended periods, especially swimming and/or sweating, make sure you apply a water resistant facial sunscreen lotion spf 30 or higher.   At night recommend a cream with retinol (a vitamin A derivative which stimulates collagen production) like CeraVe skin renewing retinol serum or ROC retinol correxion cream or Neutrogena rapid wrinkle repair cream. Retinol may cause skin irritation in people with sensitive skin.  Can use it every other day and/or apply on top of a hyaluronic acid (HA) moisturizer/serum (Neutrogena Hydroboost water cream) if better tolerated that way.  Retinol may also help with lightening brown spots.   Our office sells high quality, medically tested skin care lines such as Elta MD sunscreens (with Zinc), and Alastin skin care products, which are very effective in treating photoaging. The Alastin line includes cosmeceutical grade Vit.C serum, HA serum, Elastin stimulating moisturizers/serums, lightening serum, and sunscreens.  If you want prescription treatment, then you would need an appointment (Rx tretinoin and fade creams, Botox, filler injections, laser treatments, etc.) These prescriptions  and procedures are not covered by insurance but work very well.    Due to recent changes in healthcare laws, you may see results of your pathology and/or laboratory studies on MyChart before the doctors have had a chance to review them. We understand that in some cases there may be results that are confusing or concerning to you. Please understand that not all results are received at the same time and often the doctors may need to interpret multiple results in order to provide you with the best plan of care or course of treatment. Therefore, we ask that you please give us  2 business days to thoroughly review all your results before contacting the office for clarification. Should we see a critical lab result, you will be contacted sooner.   If You Need Anything After Your Visit  If you have any questions or concerns for your doctor, please call our main line at [phone] and press option 4 to reach your doctor's medical assistant. If no one answers, please leave a voicemail as directed and we will return your call as soon as possible. Messages left after 4 pm will be answered the following business day.   You may also send us  a message via MyChart. We typically respond to MyChart messages within 1-2 business days.  For prescription refills, please ask your pharmacy to contact our office. Our fax number is [phone].  If you have an urgent issue when the clinic is closed that cannot wait until the next business day, you can page your doctor at the number below.    Please note that while we do our best to be available for urgent  issues outside of office hours, we are not available 24/7.   If you have an urgent issue and are unable to reach us , you may choose to seek medical care at your doctor's office, retail clinic, urgent care center, or emergency room.  If you have a medical emergency, please immediately call 911 or go to the emergency department.  Pager Numbers  - Dr. Hester:  [phone]  - Dr. Jackquline: [phone]  - Dr. Claudene: [phone]   - Dr. Raymund: [phone]  In the event of inclement weather, please call our main line at [phone] for an update on the status of any delays or closures.  Dermatology Medication Tips: Please keep the boxes that topical medications come in in order to help keep track of the instructions about where and how to use these. Pharmacies typically print the medication instructions only on the boxes and not directly on the medication tubes.   If your medication is too expensive, please contact our office at [phone] option 4 or send us  a message through MyChart.   We are unable to tell what your co-pay for medications will be in advance as this is different depending on your insurance coverage. However, we may be able to find a substitute medication at lower cost or fill out paperwork to get insurance to cover a needed medication.   If a prior authorization is required to get your medication covered by your insurance company, please allow us  1-2 business days to complete this process.  Drug prices often vary depending on where the prescription is filled and some pharmacies may offer cheaper prices.  The website www.goodrx.com contains coupons for medications through different pharmacies. The prices here do not account for what the cost may be with help from insurance (it may be cheaper with your insurance), but the website can give you the price if you did not use any insurance.  - You can print the associated coupon and take it with your prescription to the pharmacy.  - You may also stop by our office during regular business hours and pick up a GoodRx coupon card.  - If you need your prescription sent electronically to a different pharmacy, notify our office through [facility] or by phone at [phone] option 4.     Si Usted Necesita Algo Despus de Su Visita  Tambin puede enviarnos un mensaje a travs  de Clinical Cytogeneticist. Por lo general respondemos a los mensajes de MyChart en el transcurso de 1 a 2 das hbiles.  Para renovar recetas, por favor pida a su farmacia que se ponga en contacto con nuestra oficina. Randi lakes de fax es [place] [phone].  Si tiene un asunto urgente cuando la clnica est cerrada y que no puede esperar hasta el siguiente da hbil, puede llamar/localizar a su doctor(a) al nmero que aparece a continuacin.   Por favor, tenga en cuenta que aunque hacemos todo lo posible para estar disponibles para asuntos urgentes fuera del horario de [place], no estamos disponibles las 24 horas del da, los 7 [address]  [address] de la [place].   Si tiene un problema urgente y no puede comunicarse con nosotros, puede optar por buscar atencin mdica  en el consultorio de su doctor(a), en una clnica privada, en un centro de atencin urgente o en una sala de emergencias.  Si tiene engineer, drilling, por favor llame inmediatamente al 911 o vaya a la sala de emergencias.  Nmeros de bper  - Dr. Hester: [phone]  - Dra. Jackquline: [phone]  -  Dr. Claudene: [phone]  - Dra. Kitts: [phone]  En caso de inclemencias del [place], por favor llame a nuestra lnea principal al [phone] para una actualizacin sobre el estado de cualquier retraso o cierre.  Consejos para la medicacin en dermatologa: Por favor, guarde las cajas en las que vienen los medicamentos de uso tpico para ayudarle a seguir las instrucciones sobre dnde y cmo usarlos. Las farmacias generalmente imprimen las instrucciones del medicamento slo en las cajas y no directamente en los tubos del [place].   Si su medicamento es muy caro, por favor, pngase en contacto con landry rieger llamando al [phone] y presione la opcin 4 o envenos un mensaje a travs de Clinical Cytogeneticist.   No podemos decirle cul ser su copago por los medicamentos por adelantado ya que esto es diferente dependiendo de la cobertura de su seguro.  Sin embargo, es posible que podamos encontrar un medicamento sustituto a audiological scientist un formulario para que el seguro cubra el medicamento que se considera necesario.   Si se requiere una autorizacin previa para que su compaa de seguros cubra su medicamento, por favor permtanos de 1 a 2 das hbiles para completar este proceso.  Los precios de los medicamentos varan con frecuencia dependiendo del environmental consultant de dnde se surte la receta y alguna farmacias pueden ofrecer precios ms baratos.  El sitio web www.goodrx.com tiene cupones para medicamentos de health and safety inspector. Los precios aqu no tienen en cuenta lo que podra costar con la ayuda del seguro (puede ser ms barato con su seguro), pero el sitio web puede darle el precio si no utiliz tourist information centre manager.  - Puede imprimir el cupn correspondiente y llevarlo con su receta a la farmacia.  - Tambin puede pasar por nuestra oficina durante el horario de atencin regular y education officer, museum una tarjeta de cupones de GoodRx.  - Si necesita que su receta se enve electrnicamente a una farmacia diferente, informe a nuestra oficina a travs de MyChart de [place] o por telfono llamando al [phone] y presione la opcin 4.

## 2024-05-14 NOTE — Progress Notes (Signed)
   Follow-Up Visit   Subjective  Brianna Stevens is a 83 y.o. female who presents for the following: rash face, ~2wk, dry and itching, applied Aloe, coconut oil and vaseline to face and has improved, no new soap, lotion, shampoo, makeup, no allergies, no animals, She does use retinol cream twice daily, but has been using it for years.   The following portions of the chart were reviewed this encounter and updated as appropriate: medications, allergies, medical history  Review of Systems:  No other skin or systemic complaints except as noted in HPI or Assessment and Plan.  Objective  Well appearing patient in no apparent distress; mood and affect are within normal limits.   A focused examination was performed of the following areas: face  Relevant exam findings are noted in the Assessment and Plan.    Assessment & Plan   Dermatitis, possible irritant contact from retinol face Exam: diffuse erythema with xerosis malar cheeks, nose, forehead, scaling eyebrows, nasal alar crease bil, scalp clear, ears clear   Treatment Plan: Start HC 2.5% cr bid to face until rash resolved up to 2 weeks Can continue Coconut oil and or Vaseline to face D/C Retinol to face while face is healing, when restart, only apply at night after moisturizer, D/C morning application retinol and don't restart. D/C vit C serum until healed, then can restart in AM and use under sunscreen.  Topical steroids (such as triamcinolone, fluocinolone, fluocinonide, mometasone, clobetasol, halobetasol, betamethasone, hydrocortisone) can cause thinning and lightening of the skin if they are used for too long in the same area. Your physician has selected the right strength medicine for your problem and area affected on the body. Please use your medication only as directed by your physician to prevent side effects.     Return for as scheduled with Dr. Hester.  I, Grayce Saunas, RMA, am acting as scribe for Rexene Rattler, MD  .   Documentation: I have reviewed the above documentation for accuracy and completeness, and I agree with the above.  Rexene Rattler, MD

## 2024-06-24 ENCOUNTER — Ambulatory Visit: Admitting: Family Medicine

## 2024-06-24 ENCOUNTER — Encounter: Payer: Self-pay | Admitting: Family Medicine

## 2024-06-24 VITALS — BP 150/90 | HR 62 | Temp 98.0°F | Ht 63.0 in | Wt 150.4 lb

## 2024-06-24 DIAGNOSIS — R7301 Impaired fasting glucose: Secondary | ICD-10-CM | POA: Diagnosis not present

## 2024-06-24 DIAGNOSIS — E7800 Pure hypercholesterolemia, unspecified: Secondary | ICD-10-CM

## 2024-06-24 DIAGNOSIS — R8290 Unspecified abnormal findings in urine: Secondary | ICD-10-CM | POA: Diagnosis not present

## 2024-06-24 DIAGNOSIS — I10 Essential (primary) hypertension: Secondary | ICD-10-CM | POA: Diagnosis not present

## 2024-06-24 DIAGNOSIS — K2960 Other gastritis without bleeding: Secondary | ICD-10-CM

## 2024-06-24 DIAGNOSIS — Z0000 Encounter for general adult medical examination without abnormal findings: Secondary | ICD-10-CM | POA: Diagnosis not present

## 2024-06-24 LAB — URINALYSIS, ROUTINE W REFLEX MICROSCOPIC
Bilirubin, UA: NEGATIVE
Glucose, UA: NEGATIVE
Ketones, UA: NEGATIVE
Nitrite, UA: NEGATIVE
Protein,UA: NEGATIVE
Specific Gravity, UA: 1.015 (ref 1.005–1.030)
Urobilinogen, Ur: 0.2 mg/dL (ref 0.2–1.0)
pH, UA: 7 (ref 5.0–7.5)

## 2024-06-24 LAB — MICROALBUMIN, URINE WAIVED
Creatinine, Urine Waived: 100 mg/dL (ref 10–300)
Microalb, Ur Waived: 30 mg/L — ABNORMAL HIGH (ref 0–19)
Microalb/Creat Ratio: <30 mg/g

## 2024-06-24 LAB — MICROSCOPIC EXAMINATION: Bacteria, UA: NONE SEEN

## 2024-06-24 LAB — BAYER DCA HB A1C WAIVED: HB A1C (BAYER DCA - WAIVED): 5.6 % (ref 4.8–5.6)

## 2024-06-24 MED ORDER — SIMVASTATIN 20 MG PO TABS: 20.0000 mg | ORAL_TABLET | Freq: Every day | ORAL | 1 refills | Status: AC

## 2024-06-24 MED ORDER — LOSARTAN POTASSIUM 50 MG PO TABS: 50.0000 mg | ORAL_TABLET | Freq: Every day | ORAL | 1 refills | Status: AC

## 2024-06-24 MED ORDER — OMEPRAZOLE 20 MG PO CPDR: 20.0000 mg | DELAYED_RELEASE_CAPSULE | Freq: Two times a day (BID) | ORAL | 1 refills | Status: AC

## 2024-06-24 NOTE — Progress Notes (Signed)
 "  Chief Complaint  Patient presents with   Medicare Wellness   Annual Exam     Subjective:   Brianna Stevens is a 83 y.o. female who presents for a Medicare Annual Wellness Visit.  Visit info / Clinical Intake: Medicare Wellness Visit Type:: Subsequent Annual Wellness Visit Persons participating in visit and providing information:: patient Medicare Wellness Visit Mode:: In-person (required for WTM) Interpreter Needed?: No Pre-visit prep was completed: no AWV questionnaire completed by patient prior to visit?: no Living arrangements:: (!) lives alone Patient's Overall Health Status Rating: excellent Typical amount of pain: none Does pain affect daily life?: no Are you currently prescribed opioids?: no  Dietary Habits and Nutritional Risks How many meals a day?: 3 Eats fruit and vegetables daily?: yes Most meals are obtained by: preparing own meals In the last 2 weeks, have you had any of the following?: none Diabetic:: no  Functional Status Activities of Daily Living (to include ambulation/medication): Independent Ambulation: Independent Medication Administration: Independent Home Management (perform basic housework or laundry): Independent Manage your own finances?: yes Primary transportation is: driving Concerns about vision?: no *vision screening is required for WTM* Concerns about hearing?: no  Fall Screening Falls in the past year?: 0 Number of falls in past year: 0 Was there an injury with Fall?: 0 Fall Risk Category Calculator: 0 Patient Fall Risk Level: Low Fall Risk  Fall Risk Patient at Risk for Falls Due to: No Fall Risks Fall risk Follow up: Falls evaluation completed  Home and Transportation Safety: All rugs have non-skid backing?: yes All stairs or steps have railings?: N/A, no stairs Grab bars in the bathtub or shower?: yes Have non-skid surface in bathtub or shower?: yes Good home lighting?: yes Regular seat belt use?: yes Hospital stays in  the last year:: no  Cognitive Assessment Difficulty concentrating, remembering, or making decisions? : no Will 6CIT or Mini Cog be Completed: yes What year is it?: 0 points What month is it?: 0 points Give patient an address phrase to remember (5 components): [address] in [place], [place] About what time is it?: 0 points Count backwards from 20 to 1: 0 points Say the months of the year in reverse: 0 points Repeat the address phrase from earlier: 2 points 6 CIT Score: 2 points  Advance Directives (For Healthcare) Does Patient Have a Medical Advance Directive?: Yes Does patient want to make changes to medical advance directive?: No - Patient declined Type of Advance Directive: Healthcare Power of [place]; Living will Copy of Healthcare Power of Attorney in Chart?: No - copy requested Copy of Living Will in Chart?: No - copy requested  Reviewed/Updated  Reviewed/Updated: Reviewed All (Medical, Surgical, Family, Medications, Allergies, Care Teams, Patient Goals)    Allergies (verified) Pantoprazole   Current Medications (verified) Outpatient Encounter Medications as of 06/24/2024  Medication Sig   ALPRAZolam  (XANAX ) 0.25 MG tablet Take 1 tablet (0.25 mg total) by mouth daily as needed for anxiety.   aspirin EC 81 MG tablet Take 81 mg by mouth daily.   calcium -vitamin D 250-100 MG-UNIT per tablet Take 1 tablet by mouth 2 (two) times daily.   carbamide peroxide (DEBROX) 6.5 % OTIC solution Place 5 drops into both ears 2 (two) times daily.   hydrocortisone  2.5 % cream Apply topically 2 (two) times daily. Bid to aa rash on face up to 2 weeks   meclizine  (ANTIVERT ) 25 MG tablet Take 1 tablet (25 mg total) by mouth 3 (three) times daily as needed.  Multiple Vitamin (MULTI-VITAMINS) TABS Take by mouth.   [DISCONTINUED] losartan  (COZAAR ) 50 MG tablet Take 1 tablet (50 mg total) by mouth daily.   [DISCONTINUED] omeprazole  (PRILOSEC) 20 MG capsule Take 1 capsule (20 mg total) by mouth 2  (two) times daily.   [DISCONTINUED] simvastatin  (ZOCOR ) 20 MG tablet Take 1 tablet (20 mg total) by mouth daily.   losartan  (COZAAR ) 50 MG tablet Take 1 tablet (50 mg total) by mouth daily.   omeprazole  (PRILOSEC) 20 MG capsule Take 1 capsule (20 mg total) by mouth 2 (two) times daily.   simvastatin  (ZOCOR ) 20 MG tablet Take 1 tablet (20 mg total) by mouth daily.   No facility-administered encounter medications on file as of 06/24/2024.    History: Past Medical History:  Diagnosis Date   Actinic keratosis    GERD (gastroesophageal reflux disease)    Hx of adenomatous colonic polyps    Hyperlipidemia    Past Surgical History:  Procedure Laterality Date   APPENDECTOMY     BREAST CYST ASPIRATION Bilateral    CHOLECYSTECTOMY     COLONOSCOPY     COLONOSCOPY WITH PROPOFOL  N/A 09/10/2019   Procedure: COLONOSCOPY WITH PROPOFOL ;  Surgeon: Toledo, Ladell POUR, MD;  Location: [facility] ENDOSCOPY;  Service: Gastroenterology;  Laterality: N/A;   ESOPHAGOGASTRODUODENOSCOPY     FLEXIBLE SIGMOIDOSCOPY     VARICOSE VEIN SURGERY Bilateral    done at [facility]   Family History  Problem Relation Age of Onset   Prostate cancer Brother 46   Prostate cancer Brother 67   Breast cancer Neg Hx    Social History   Occupational History   Occupation: retired  Tobacco Use   Smoking status: Never   Smokeless tobacco: Never  Vaping Use   Vaping status: Never Used  Substance and Sexual Activity   Alcohol use: Yes    Alcohol/week: 1.0 standard drink of alcohol    Types: 1 Glasses of wine per week    Comment: social   Drug use: No   Sexual activity: Not Currently   Tobacco Counseling Counseling given: Not Answered  SDOH Screenings   Food Insecurity: No Food Insecurity (06/24/2024)  Housing: Low Risk (06/24/2024)  Transportation Needs: No Transportation Needs (06/24/2024)  Utilities: Not At Risk (06/24/2024)  Alcohol Screen: Low Risk (06/06/2021)  Depression (PHQ2-9): Low Risk  (06/24/2024)  Financial Resource Strain: Low Risk (06/06/2021)  Physical Activity: Insufficiently Active (06/24/2024)  Social Connections: Socially Isolated (06/24/2024)  Stress: No Stress Concern Present (06/24/2024)  Tobacco Use: Low Risk (06/24/2024)  Health Literacy: Adequate Health Literacy (06/24/2024)   See flowsheets for full screening details  Depression Screen PHQ 2 & 9 Depression Scale- Over the past 2 weeks, how often have you been bothered by any of the following problems? Little interest or pleasure in doing things: 0 Feeling down, depressed, or hopeless (PHQ Adolescent also includes...irritable): 0 PHQ-2 Total Score: 0 Trouble falling or staying asleep, or sleeping too much: 0 Feeling tired or having little energy: 0 Poor appetite or overeating (PHQ Adolescent also includes...weight loss): 0 Feeling bad about yourself - or that you are a failure or have let yourself or your family down: 0 Trouble concentrating on things, such as reading the newspaper or watching television (PHQ Adolescent also includes...like school work): 0 Moving or speaking so slowly that other people could have noticed. Or the opposite - being so fidgety or restless that you have been moving around a lot more than usual: 0 Thoughts that you would be  better off dead, or of hurting yourself in some way: 0 PHQ-9 Total Score: 0 If you checked off any problems, how difficult have these problems made it for you to do your work, take care of things at home, or get along with other people?: Not difficult at all     Goals Addressed   None          Objective:    Today's Vitals   06/24/24 0947 06/24/24 0959 06/24/24 1014  BP: (!) 154/85 (!) 144/82 (!) 150/90  Pulse: 60 62   Temp: 98 F (36.7 C)    TempSrc: Oral    SpO2: 98%    Weight: 150 lb 6.4 oz (68.2 kg)    Height: 5' 3 (1.6 m)    PainSc: 0-No pain     Body mass index is 26.64 kg/m.  Hearing/Vision screen No results found. Immunizations  and Health Maintenance Health Maintenance  Topic Date Due   Zoster Vaccines- Shingrix (1 of 2) 09/22/2024 (Originally 07/20/1959)   Influenza Vaccine  09/30/2024 (Originally 02/01/2024)   DTaP/Tdap/Td (1 - Tdap) 06/24/2025 (Originally 07/20/1959)   Bone Density Scan  05/03/2025   Medicare Annual Wellness (AWV)  06/24/2025   Pneumococcal Vaccine: 50+ Years  Completed   Meningococcal B Vaccine  Aged Out   COVID-19 Vaccine  Discontinued        Assessment/Plan:  This is a routine wellness examination for [facility].  Patient Care Team: Vicci Duwaine SQUIBB, DO as PCP - General (Family Medicine) Schermerhorn, Debby PARAS, MD as Referring Physician (Obstetrics and Gynecology)  I have personally reviewed and noted the following in the patients chart:   Medical and social history Use of alcohol, tobacco or illicit drugs  Current medications and supplements including opioid prescriptions. Functional ability and status Nutritional status Physical activity Advanced directives List of other physicians Hospitalizations, surgeries, and ER visits in previous 12 months Vitals Screenings to include cognitive, depression, and falls Referrals and appointments  Orders Placed This Encounter  Procedures   Microscopic Examination   CBC with Differential/Platelet   Comprehensive metabolic panel with GFR    Has the patient fasted?:   Yes   Lipid Panel w/o Chol/HDL Ratio    Has the patient fasted?:   Yes   TSH   Microalbumin, Urine Waived   Bayer DCA Hb A1c Waived   Urinalysis, Routine w reflex microscopic   In addition, I have reviewed and discussed with patient certain preventive protocols, quality metrics, and best practice recommendations. A written personalized care plan for preventive services as well as general preventive health recommendations were provided to patient.   Duwaine Vicci, DO   06/24/2024   Return in about 6 months (around 12/23/2024).  After Visit Summary: (In Person-Printed) AVS  printed and given to the patient  "

## 2024-06-24 NOTE — Progress Notes (Signed)
 "  BP (!) 150/90   Pulse 62   Temp 98 F (36.7 C) (Oral)   Ht 5' 3 (1.6 m)   Wt 150 lb 6.4 oz (68.2 kg)   SpO2 98%   BMI 26.64 kg/m    Subjective:    Patient ID: Brianna Stevens, female    DOB: 1940-08-11, 83 y.o.   MRN: [id number]  HPI: Brianna Stevens is a 83 y.o. female presenting on 06/24/2024 for comprehensive medical examination. Current medical complaints include:  HYPERTENSION / HYPERLIPIDEMIA Satisfied with current treatment? yes Duration of hypertension: chronic BP monitoring frequency: not checking BP medication side effects: no Past BP meds: losartan  Duration of hyperlipidemia: chronic Cholesterol medication side effects: no Cholesterol supplements: none Past cholesterol medications: simvastatin  Medication compliance: excellent compliance Aspirin: yes Recent stressors: no Recurrent headaches: no Visual changes: no Palpitations: no Dyspnea: no Chest pain: no Lower extremity edema: no Dizzy/lightheaded: no  Impaired Fasting Glucose HbA1C:  Lab Results  Component Value Date   HGBA1C 5.6 06/24/2024   Duration of elevated blood sugar: chronic Polydipsia: no Polyuria: no Weight change: no Visual disturbance: no Glucose Monitoring: no Diabetic Education: Completed Family history of diabetes: no   She currently lives with: alone Menopausal Symptoms: no  Functional Status Survey: Is the patient deaf or have difficulty hearing?: No Does the patient have difficulty seeing, even when wearing glasses/contacts?: No Does the patient have difficulty concentrating, remembering, or making decisions?: Yes Does the patient have difficulty walking or climbing stairs?: No Does the patient have difficulty dressing or bathing?: No Does the patient have difficulty doing errands alone such as visiting a doctor's office or shopping?: No     06/24/2024    9:51 AM 07/20/2023    9:05 AM 06/21/2023   10:31 AM 06/13/2022    2:47 PM 12/06/2021   10:19 AM  Fall Risk    Falls in the past year? 0 0 0 0 0  Number falls in past yr: 0  0 0 0  Injury with Fall? 0  0  0  0   Risk for fall due to : No Fall Risks  No Fall Risks No Fall Risks No Fall Risks  Follow up Falls evaluation completed  Falls evaluation completed Falls evaluation completed  Falls evaluation completed      Data saved with a previous flowsheet row definition    Depression Screen    06/24/2024    9:59 AM 12/21/2023    9:27 AM 07/20/2023    9:04 AM 06/21/2023   10:31 AM 12/20/2022    9:36 AM  Depression screen PHQ 2/9  Decreased Interest 0 0 0 0 0  Down, Depressed, Hopeless 0 0 0 0 0  PHQ - 2 Score 0 0 0 0 0  Altered sleeping 0 1 2 3 2   Tired, decreased energy 0 0 0 0 0  Change in appetite 0 0 0 0 0  Feeling bad or failure about yourself  0 0 0 0 0  Trouble concentrating 0 0 0 0 0  Moving slowly or fidgety/restless 0 0 0 0 0  Suicidal thoughts 0 0 0 0 0  PHQ-9 Score 0 1  2  3  2    Difficult doing work/chores Not difficult at all  Not difficult at all Not difficult at all Not difficult at all     Data saved with a previous flowsheet row definition    Advanced Directives Does patient have a HCPOA?    no Does  patient have a living will or MOST form?  no  Past Medical History:  Past Medical History:  Diagnosis Date   Actinic keratosis    GERD (gastroesophageal reflux disease)    Hx of adenomatous colonic polyps    Hyperlipidemia     Surgical History:  Past Surgical History:  Procedure Laterality Date   APPENDECTOMY     BREAST CYST ASPIRATION Bilateral    CHOLECYSTECTOMY     COLONOSCOPY     COLONOSCOPY WITH PROPOFOL  N/A 09/10/2019   Procedure: COLONOSCOPY WITH PROPOFOL ;  Surgeon: Toledo, Ladell POUR, MD;  Location: [facility] ENDOSCOPY;  Service: Gastroenterology;  Laterality: N/A;   ESOPHAGOGASTRODUODENOSCOPY     FLEXIBLE SIGMOIDOSCOPY     VARICOSE VEIN SURGERY Bilateral    done at [facility]    Medications:  Current Outpatient Medications on File Prior to  Visit  Medication Sig   ALPRAZolam  (XANAX ) 0.25 MG tablet Take 1 tablet (0.25 mg total) by mouth daily as needed for anxiety.   aspirin EC 81 MG tablet Take 81 mg by mouth daily.   calcium -vitamin D 250-100 MG-UNIT per tablet Take 1 tablet by mouth 2 (two) times daily.   carbamide peroxide (DEBROX) 6.5 % OTIC solution Place 5 drops into both ears 2 (two) times daily.   hydrocortisone  2.5 % cream Apply topically 2 (two) times daily. Bid to aa rash on face up to 2 weeks   meclizine  (ANTIVERT ) 25 MG tablet Take 1 tablet (25 mg total) by mouth 3 (three) times daily as needed.   Multiple Vitamin (MULTI-VITAMINS) TABS Take by mouth.   No current facility-administered medications on file prior to visit.    Allergies:  Allergies[1]  Social History:  Social History   Socioeconomic History   Marital status: Widowed    Spouse name: Not on file   Number of children: Not on file   Years of education: 13   Highest education level: Some college, no degree  Occupational History   Occupation: retired  Tobacco Use   Smoking status: Never   Smokeless tobacco: Never  Vaping Use   Vaping status: Never Used  Substance and Sexual Activity   Alcohol use: Yes    Alcohol/week: 1.0 standard drink of alcohol    Types: 1 Glasses of wine per week    Comment: social   Drug use: No   Sexual activity: Not Currently  Other Topics Concern   Not on file  Social History Narrative   Senior citizens activities    Social Drivers of Health   Tobacco Use: Low Risk (06/24/2024)   Patient History    Smoking Tobacco Use: Never    Smokeless Tobacco Use: Never    Passive Exposure: Not on file  Financial Resource Strain: Low Risk (06/06/2021)   Overall Financial Resource Strain (CARDIA)    Difficulty of Paying Living Expenses: Not hard at all  Food Insecurity: No Food Insecurity (06/24/2024)   Epic    Worried About Radiation Protection Practitioner of Food in the Last Year: Never true    Ran Out of Food in the Last Year: Never  true  Transportation Needs: No Transportation Needs (06/24/2024)   Epic    Lack of Transportation (Medical): No    Lack of Transportation (Non-Medical): No  Physical Activity: Insufficiently Active (06/24/2024)   Exercise Vital Sign    Days of Exercise per Week: 4 days    Minutes of Exercise per Session: 30 min  Stress: No Stress Concern Present (06/24/2024)   [place]  [employer] - Occupational Stress Questionnaire    Feeling of Stress: Only a little  Social Connections: Socially Isolated (06/24/2024)   Social Connection and Isolation Panel    Frequency of Communication with Friends and Family: More than three times a week    Frequency of Social Gatherings with Friends and Family: Twice a week    Attends Religious Services: Never    Database Administrator or Organizations: No    Attends Banker Meetings: Never    Marital Status: Widowed  Intimate Partner Violence: Not At Risk (06/24/2024)   Epic    Fear of Current or Ex-Partner: No    Emotionally Abused: No    Physically Abused: No    Sexually Abused: No  Depression (PHQ2-9): Low Risk (06/24/2024)   Depression (PHQ2-9)    PHQ-2 Score: 0  Alcohol Screen: Low Risk (06/06/2021)   Alcohol Screen    Last Alcohol Screening Score (AUDIT): 4  Housing: Low Risk (06/24/2024)   Epic    Unable to Pay for Housing in the Last Year: No    Number of Times Moved in the Last Year: 0    Homeless in the Last Year: No  Utilities: Not At Risk (06/24/2024)   Epic    Threatened with loss of utilities: No  Health Literacy: Adequate Health Literacy (06/24/2024)   B1300 Health Literacy    Frequency of need for help with medical instructions: Never   Tobacco Use History[2] Social History   Substance and Sexual Activity  Alcohol Use Yes   Alcohol/week: 1.0 standard drink of alcohol   Types: 1 Glasses of wine per week   Comment: social    Family History:  Family History  Problem Relation Age of Onset    Prostate cancer Brother 76   Prostate cancer Brother 41   Breast cancer Neg Hx     Past medical history, surgical history, medications, allergies, family history and social history reviewed with patient today and changes made to appropriate areas of the chart.   Review of Systems  Constitutional: Negative.   HENT: Negative.    Eyes: Negative.   Respiratory: Negative.    Cardiovascular: Negative.   Gastrointestinal: Negative.   Genitourinary: Negative.   Musculoskeletal: Negative.        L heel pain  Skin: Negative.   Neurological: Negative.   Endo/Heme/Allergies: Negative.   Psychiatric/Behavioral: Negative.      All other ROS negative except what is listed above and in the HPI.      Objective:    BP (!) 150/90   Pulse 62   Temp 98 F (36.7 C) (Oral)   Ht 5' 3 (1.6 m)   Wt 150 lb 6.4 oz (68.2 kg)   SpO2 98%   BMI 26.64 kg/m   Wt Readings from Last 3 Encounters:  06/24/24 150 lb 6.4 oz (68.2 kg)  12/21/23 150 lb 6.4 oz (68.2 kg)  07/20/23 152 lb (68.9 kg)     Physical Exam Vitals and nursing note reviewed.  Constitutional:      General: She is not in acute distress.    Appearance: Normal appearance. She is normal weight. She is not ill-appearing, toxic-appearing or diaphoretic.  HENT:     Head: Normocephalic and atraumatic.     Right Ear: Tympanic membrane, ear canal and external ear normal. There is no impacted cerumen.     Left Ear: Tympanic membrane, ear canal and external ear normal. There is no impacted cerumen.  Nose: Nose normal. No congestion or rhinorrhea.     Mouth/Throat:     Mouth: Mucous membranes are moist.     Pharynx: Oropharynx is clear. No oropharyngeal exudate or posterior oropharyngeal erythema.  Eyes:     General: No scleral icterus.       Right eye: No discharge.        Left eye: No discharge.     Extraocular Movements: Extraocular movements intact.     Conjunctiva/sclera: Conjunctivae normal.     Pupils: Pupils are equal, round,  and reactive to light.  Neck:     Vascular: No carotid bruit.  Cardiovascular:     Rate and Rhythm: Normal rate and regular rhythm.     Pulses: Normal pulses.     Heart sounds: Murmur heard.     No friction rub. No gallop.  Pulmonary:     Effort: Pulmonary effort is normal. No respiratory distress.     Breath sounds: Normal breath sounds. No stridor. No wheezing, rhonchi or rales.  Chest:     Chest wall: No tenderness.  Abdominal:     General: Abdomen is flat. Bowel sounds are normal. There is no distension.     Palpations: Abdomen is soft. There is no mass.     Tenderness: There is no abdominal tenderness. There is no right CVA tenderness, left CVA tenderness, guarding or rebound.     Hernia: No hernia is present.  Genitourinary:    Comments: Breast and pelvic exams deferred with shared decision making Musculoskeletal:        General: No swelling, tenderness, deformity or signs of injury.     Cervical back: Normal range of motion and neck supple. No rigidity. No muscular tenderness.     Right lower leg: No edema.     Left lower leg: No edema.  Lymphadenopathy:     Cervical: No cervical adenopathy.  Skin:    General: Skin is warm and dry.     Capillary Refill: Capillary refill takes less than 2 seconds.     Coloration: Skin is not jaundiced or pale.     Findings: No bruising, erythema, lesion or rash.  Neurological:     General: No focal deficit present.     Mental Status: She is alert and oriented to person, place, and time. Mental status is at baseline.     Cranial Nerves: No cranial nerve deficit.     Sensory: No sensory deficit.     Motor: No weakness.     Coordination: Coordination normal.     Gait: Gait normal.     Deep Tendon Reflexes: Reflexes normal.  Psychiatric:        Mood and Affect: Mood normal.        Behavior: Behavior normal.        Thought Content: Thought content normal.        Judgment: Judgment normal.        06/24/2024    9:51 AM 06/21/2023    10:53 AM 06/13/2022    3:05 PM 12/25/2018    9:39 AM 12/17/2017    9:51 AM  6CIT Screen  What Year? 0 points 0 points 0 points 0 points 0 points  What month? 0 points 0 points 0 points 0 points 0 points  What time? 0 points 0 points 0 points 0 points 0 points  Count back from 20 0 points 0 points 0 points 0 points 0 points  Months in reverse 0 points 0 points 0 points  0 points 0 points  Repeat phrase 2 points 2 points 4 points 0 points 4 points  Total Score 2 points 2 points 4 points 0 points 4 points    Results for orders placed or performed in visit on 06/24/24  Microscopic Examination   Collection Time: 06/24/24 10:15 AM   Urine  Result Value Ref Range   WBC, UA 0-5 0 - 5 /hpf   RBC, Urine 0-2 0 - 2 /hpf   Epithelial Cells (non renal) 0-10 0 - 10 /hpf   Bacteria, UA None seen None seen/Few  Microalbumin, Urine Waived   Collection Time: 06/24/24 10:15 AM  Result Value Ref Range   Microalb, Ur Waived 30 (H) 0 - 19 mg/L   Creatinine, Urine Waived 100 10 - 300 mg/dL   Microalb/Creat Ratio <30 <30 mg/g  Bayer DCA Hb A1c Waived   Collection Time: 06/24/24 10:15 AM  Result Value Ref Range   HB A1C (BAYER DCA - WAIVED) 5.6 4.8 - 5.6 %  Urinalysis, Routine w reflex microscopic   Collection Time: 06/24/24 10:15 AM  Result Value Ref Range   Specific Gravity, UA 1.015 1.005 - 1.030   pH, UA 7.0 5.0 - 7.5   Color, UA Yellow Yellow   Appearance Ur Clear Clear   Leukocytes,UA 1+ (A) Negative   Protein,UA Negative Negative/Trace   Glucose, UA Negative Negative   Ketones, UA Negative Negative   RBC, UA Trace (A) Negative   Bilirubin, UA Negative Negative   Urobilinogen, Ur 0.2 0.2 - 1.0 mg/dL   Nitrite, UA Negative Negative   Microscopic Examination See below:       Assessment & Plan:   Problem List Items Addressed This Visit       Cardiovascular and Mediastinum   HTN (hypertension)   Running high. Will work on [employer] and recheck in 6 weeks. Call with any concerns.        Relevant Medications   losartan  (COZAAR ) 50 MG tablet   simvastatin  (ZOCOR ) 20 MG tablet   Other Relevant Orders   CBC with Differential/Platelet   Comprehensive metabolic panel with GFR   TSH   Microalbumin, Urine Waived (Completed)     Digestive   Reflux gastritis   Under good control on current regimen. Continue current regimen. Continue to monitor. Call with any concerns. Refills given. Labs drawn today.       Relevant Orders   CBC with Differential/Platelet   Comprehensive metabolic panel with GFR     Endocrine   IFG (impaired fasting glucose)   Rechecking labs today. Await results. Treat as needed.       Relevant Orders   CBC with Differential/Platelet   Comprehensive metabolic panel with GFR   Bayer DCA Hb A1c Waived (Completed)     Other   Hypercholesteremia   Under good control on current regimen. Continue current regimen. Continue to monitor. Call with any concerns. Refills given. Labs drawn today.        Relevant Medications   losartan  (COZAAR ) 50 MG tablet   simvastatin  (ZOCOR ) 20 MG tablet   Other Relevant Orders   CBC with Differential/Platelet   Comprehensive metabolic panel with GFR   Lipid Panel w/o Chol/HDL Ratio   Other Visit Diagnoses       Encounter for Medicare annual wellness exam    -  Primary   Preventative care discussed today as below.     Routine general medical examination at a health care facility  Vaccines up to date/declined. Screening labs checked today. DEXA up to date. Continue diet and exercise. Call with any concerns.     Foul smelling urine       Will check urine. Await results. Treat as needed.   Relevant Orders   Urinalysis, Routine w reflex microscopic (Completed)        Preventative Services:  Health Risk Assessment and Personalized Prevention Plan: Done today Bone Mass Measurements: Up to date Breast Cancer Screening: N/A CVD Screening: Done today Cervical Cancer Screening: N/A Colon Cancer Screening:  N/A Depression Screening: done today Diabetes Screening: done today Glaucoma Screening: see your eye doctor Hepatitis B vaccine: N/A Hepatitis C screening: Up to date HIV Screening: up to date Flu Vaccine: declined Lung cancer Screening: N/A Obesity Screening: done today Pneumonia Vaccines: Up to date STI Screening: N/A  Follow up plan: Return in about 6 months (around 12/23/2024).   LABORATORY TESTING:  - Pap smear: not applicable  IMMUNIZATIONS:   - Tdap: Tetanus vaccination status reviewed: Declined. - Influenza: Refused - Pneumovax: Up to date - Prevnar: Up to date - Zostavax vaccine: Refused  SCREENING: -Mammogram: Not applicable  - Colonoscopy: Not applicable  - Bone Density: Up to date   PATIENT COUNSELING:   Advised to take 1 mg of folate supplement per day if capable of pregnancy.   Sexuality: Discussed sexually transmitted diseases, partner selection, use of condoms, avoidance of unintended pregnancy  and contraceptive alternatives.   Advised to avoid cigarette smoking.  I discussed with the patient that most people either abstain from alcohol or drink within safe limits (<=14/week and <=4 drinks/occasion for males, <=7/weeks and <= 3 drinks/occasion for females) and that the risk for alcohol disorders and other health effects rises proportionally with the number of drinks per week and how often a drinker exceeds daily limits.  Discussed cessation/primary prevention of drug use and availability of treatment for abuse.   Diet: Encouraged to adjust caloric intake to maintain  or achieve ideal body weight, to reduce intake of dietary saturated fat and total fat, to limit sodium intake by avoiding high sodium foods and not adding table salt, and to maintain adequate dietary potassium and calcium  preferably from fresh fruits, vegetables, and low-fat dairy products.    stressed the importance of regular exercise  Injury prevention: Discussed safety belts, safety  helmets, smoke detector, smoking near bedding or upholstery.   Dental health: Discussed importance of regular tooth brushing, flossing, and dental visits.    NEXT PREVENTATIVE PHYSICAL DUE IN 1 YEAR. Return in about 6 months (around 12/23/2024).               [1]  Allergies Allergen Reactions   Pantoprazole Palpitations  [2]  Social History Tobacco Use  Smoking Status Never  Smokeless Tobacco Never   "

## 2024-06-24 NOTE — Assessment & Plan Note (Signed)
 Under good control on current regimen. Continue current regimen. Continue to monitor. Call with any concerns. Refills given. Labs drawn today.

## 2024-06-24 NOTE — Assessment & Plan Note (Signed)
 Running high. Will work on [employer] and recheck in 6 weeks. Call with any concerns.

## 2024-06-24 NOTE — Patient Instructions (Addendum)
 Brianna Stevens,  Thank you for taking the time for your Medicare Wellness Visit. I appreciate your continued commitment to your health goals. Please review the care plan we discussed, and feel free to reach out if I can assist you further.  Please note that Annual Wellness Visits do not include a physical exam. Some assessments may be limited, especially if the visit was conducted virtually. If needed, we may recommend an in-person follow-up with your provider.  Ongoing Care Seeing your primary care provider every 3 to 6 months helps us  monitor your health and provide consistent, personalized care.   Referrals If a referral was made during today's visit and you haven't received any updates within two weeks, please contact the referred provider directly to check on the status.  Recommended Screenings:  Health Maintenance  Topic Date Due   Zoster (Shingles) Vaccine (1 of 2) 09/22/2024*   Flu Shot  09/30/2024*   DTaP/Tdap/Td vaccine (1 - Tdap) 06/24/2025*   Osteoporosis screening with Bone Density Scan  05/03/2025   Medicare Annual Wellness Visit  06/24/2025   Pneumococcal Vaccine for age over 28  Completed   Meningitis B Vaccine  Aged Out   COVID-19 Vaccine  Discontinued  *Topic was postponed. The date shown is not the original due date.       06/24/2024    9:51 AM  Advanced Directives  Does Patient Have a Medical Advance Directive? Yes  Type of Estate Agent of [place];Living will  Does patient want to make changes to medical advance directive? No - Patient declined  Copy of Healthcare Power of Attorney in Chart? No - copy requested    Vision: Annual vision screenings are recommended for early detection of glaucoma, cataracts, and diabetic retinopathy. These exams can also reveal signs of chronic conditions such as diabetes and high blood pressure.  Dental: Annual dental screenings help detect early signs of oral cancer, gum disease, and other conditions  linked to overall health, including heart disease and diabetes.  Please see the attached documents for additional preventive care recommendations.   Preventative Services:  Health Risk Assessment and Personalized Prevention Plan: Done today Bone Mass Measurements: Up to date Breast Cancer Screening: N/A CVD Screening: Done today Cervical Cancer Screening: N/A Colon Cancer Screening: N/A Depression Screening: done today Diabetes Screening: done today Glaucoma Screening: see your eye doctor Hepatitis B vaccine: N/A Hepatitis C screening: Up to date HIV Screening: up to date Flu Vaccine: declined Lung cancer Screening: N/A Obesity Screening: done today Pneumonia Vaccines: Up to date STI Screening: N/A

## 2024-06-24 NOTE — Assessment & Plan Note (Signed)
 Rechecking labs today. Await results. Treat as needed.

## 2024-06-25 ENCOUNTER — Ambulatory Visit: Payer: Self-pay | Admitting: Family Medicine

## 2024-06-25 LAB — COMPREHENSIVE METABOLIC PANEL WITH GFR
ALT: 11 IU/L (ref 0–32)
AST: 20 IU/L (ref 0–40)
Albumin: 4.3 g/dL (ref 3.7–4.7)
Alkaline Phosphatase: 79 IU/L (ref 48–129)
BUN/Creatinine Ratio: 15 (ref 12–28)
BUN: 15 mg/dL (ref 8–27)
Bilirubin Total: 0.5 mg/dL (ref 0.0–1.2)
CO2: 27 mmol/L (ref 20–29)
Calcium: 10.1 mg/dL (ref 8.7–10.3)
Chloride: 102 mmol/L (ref 96–106)
Creatinine, Ser: 0.98 mg/dL (ref 0.57–1.00)
Globulin, Total: 3.1 g/dL (ref 1.5–4.5)
Glucose: 91 mg/dL (ref 70–99)
Potassium: 4.7 mmol/L (ref 3.5–5.2)
Sodium: 141 mmol/L (ref 134–144)
Total Protein: 7.4 g/dL (ref 6.0–8.5)
eGFR: 57 mL/min/1.73 — ABNORMAL LOW

## 2024-06-25 LAB — CBC WITH DIFFERENTIAL/PLATELET
Basophils Absolute: 0.1 x10E3/uL (ref 0.0–0.2)
Basos: 1 %
EOS (ABSOLUTE): 0.2 x10E3/uL (ref 0.0–0.4)
Eos: 2 %
Hematocrit: 45.6 % (ref 34.0–46.6)
Hemoglobin: 15.1 g/dL (ref 11.1–15.9)
Immature Grans (Abs): 0 x10E3/uL (ref 0.0–0.1)
Immature Granulocytes: 0 %
Lymphocytes Absolute: 4.2 x10E3/uL — ABNORMAL HIGH (ref 0.7–3.1)
Lymphs: 60 %
MCH: 30.8 pg (ref 26.6–33.0)
MCHC: 33.1 g/dL (ref 31.5–35.7)
MCV: 93 fL (ref 79–97)
Monocytes Absolute: 0.7 x10E3/uL (ref 0.1–0.9)
Monocytes: 9 %
Neutrophils Absolute: 1.9 x10E3/uL (ref 1.4–7.0)
Neutrophils: 28 %
Platelets: 241 x10E3/uL (ref 150–450)
RBC: 4.91 x10E6/uL (ref 3.77–5.28)
RDW: 11.9 % (ref 11.7–15.4)
WBC: 7.1 x10E3/uL (ref 3.4–10.8)

## 2024-06-25 LAB — LIPID PANEL W/O CHOL/HDL RATIO
Cholesterol, Total: 175 mg/dL (ref 100–199)
HDL: 64 mg/dL
LDL Chol Calc (NIH): 96 mg/dL (ref 0–99)
Triglycerides: 83 mg/dL (ref 0–149)
VLDL Cholesterol Cal: 15 mg/dL (ref 5–40)

## 2024-06-25 LAB — TSH: TSH: 2.55 u[IU]/mL (ref 0.450–4.500)

## 2024-08-25 ENCOUNTER — Ambulatory Visit: Payer: Self-pay | Admitting: Family Medicine

## 2024-11-11 ENCOUNTER — Ambulatory Visit: Admitting: Dermatology

## 2024-11-17 ENCOUNTER — Ambulatory Visit: Admitting: Dermatology

## 2024-12-23 ENCOUNTER — Ambulatory Visit: Payer: Self-pay | Admitting: Family Medicine
